# Patient Record
Sex: Female | Born: 1975 | Race: White | Hispanic: No | Marital: Married | Smoking: Never smoker
Health system: Southern US, Community
[De-identification: ages and names within clinical notes are randomized; demographics above are authoritative.]

## PROBLEM LIST (undated history)

## (undated) DIAGNOSIS — G4733 Obstructive sleep apnea (adult) (pediatric): Secondary | ICD-10-CM

## (undated) DIAGNOSIS — K219 Gastro-esophageal reflux disease without esophagitis: Secondary | ICD-10-CM

## (undated) DIAGNOSIS — E559 Vitamin D deficiency, unspecified: Secondary | ICD-10-CM

## (undated) DIAGNOSIS — F419 Anxiety disorder, unspecified: Secondary | ICD-10-CM

## (undated) DIAGNOSIS — R079 Chest pain, unspecified: Secondary | ICD-10-CM

## (undated) DIAGNOSIS — F32A Depression, unspecified: Secondary | ICD-10-CM

## (undated) DIAGNOSIS — I517 Cardiomegaly: Secondary | ICD-10-CM

## (undated) DIAGNOSIS — F329 Major depressive disorder, single episode, unspecified: Secondary | ICD-10-CM

## (undated) DIAGNOSIS — E785 Hyperlipidemia, unspecified: Secondary | ICD-10-CM

## (undated) DIAGNOSIS — F509 Eating disorder, unspecified: Secondary | ICD-10-CM

## (undated) DIAGNOSIS — E079 Disorder of thyroid, unspecified: Secondary | ICD-10-CM

## (undated) DIAGNOSIS — E119 Type 2 diabetes mellitus without complications: Secondary | ICD-10-CM

## (undated) DIAGNOSIS — G43909 Migraine, unspecified, not intractable, without status migrainosus: Secondary | ICD-10-CM

## (undated) DIAGNOSIS — E538 Deficiency of other specified B group vitamins: Secondary | ICD-10-CM

## (undated) DIAGNOSIS — E282 Polycystic ovarian syndrome: Secondary | ICD-10-CM

## (undated) HISTORY — DX: Polycystic ovarian syndrome: E28.2

## (undated) HISTORY — PX: OTHER SURGICAL HISTORY: SHX169

## (undated) HISTORY — DX: Disorder of thyroid, unspecified: E07.9

## (undated) HISTORY — PX: BREAST SURGERY: SHX581

## (undated) HISTORY — DX: Deficiency of other specified B group vitamins: E53.8

## (undated) HISTORY — DX: Obstructive sleep apnea (adult) (pediatric): G47.33

## (undated) HISTORY — DX: Anxiety disorder, unspecified: F41.9

## (undated) HISTORY — DX: Chest pain, unspecified: R07.9

## (undated) HISTORY — DX: Hyperlipidemia, unspecified: E78.5

## (undated) HISTORY — DX: Vitamin D deficiency, unspecified: E55.9

## (undated) HISTORY — DX: Eating disorder, unspecified: F50.9

## (undated) HISTORY — DX: Cardiomegaly: I51.7

## (undated) HISTORY — DX: Type 2 diabetes mellitus without complications: E11.9

## (undated) HISTORY — DX: Migraine, unspecified, not intractable, without status migrainosus: G43.909

## (undated) HISTORY — DX: Gastro-esophageal reflux disease without esophagitis: K21.9

---

## 2000-12-11 ENCOUNTER — Emergency Department (HOSPITAL_COMMUNITY): Admission: EM | Admit: 2000-12-11 | Discharge: 2000-12-11 | Payer: Self-pay | Admitting: Emergency Medicine

## 2000-12-11 ENCOUNTER — Encounter: Payer: Self-pay | Admitting: Emergency Medicine

## 2003-05-28 ENCOUNTER — Inpatient Hospital Stay (HOSPITAL_COMMUNITY): Admission: AD | Admit: 2003-05-28 | Discharge: 2003-05-28 | Payer: Self-pay | Admitting: Obstetrics & Gynecology

## 2004-02-01 ENCOUNTER — Encounter: Admission: RE | Admit: 2004-02-01 | Discharge: 2004-02-01 | Payer: Self-pay | Admitting: Family Medicine

## 2006-08-02 ENCOUNTER — Ambulatory Visit (HOSPITAL_COMMUNITY): Admission: RE | Admit: 2006-08-02 | Discharge: 2006-08-03 | Payer: Self-pay | Admitting: Neurosurgery

## 2006-09-26 ENCOUNTER — Encounter: Admission: RE | Admit: 2006-09-26 | Discharge: 2006-09-26 | Payer: Self-pay | Admitting: Neurosurgery

## 2007-06-09 ENCOUNTER — Inpatient Hospital Stay (HOSPITAL_COMMUNITY): Admission: AD | Admit: 2007-06-09 | Discharge: 2007-06-09 | Payer: Self-pay | Admitting: *Deleted

## 2007-09-02 ENCOUNTER — Inpatient Hospital Stay (HOSPITAL_COMMUNITY): Admission: AD | Admit: 2007-09-02 | Discharge: 2007-09-06 | Payer: Self-pay | Admitting: Obstetrics

## 2007-09-15 ENCOUNTER — Ambulatory Visit: Admission: RE | Admit: 2007-09-15 | Discharge: 2007-09-15 | Payer: Self-pay | Admitting: Obstetrics

## 2009-02-27 ENCOUNTER — Ambulatory Visit: Payer: Self-pay | Admitting: Physician Assistant

## 2009-02-27 ENCOUNTER — Inpatient Hospital Stay (HOSPITAL_COMMUNITY): Admission: AD | Admit: 2009-02-27 | Discharge: 2009-02-27 | Payer: Self-pay | Admitting: Obstetrics

## 2010-03-26 LAB — URINALYSIS, ROUTINE W REFLEX MICROSCOPIC
Bilirubin Urine: NEGATIVE
Glucose, UA: NEGATIVE mg/dL
Ketones, ur: NEGATIVE mg/dL
Leukocytes, UA: NEGATIVE
Nitrite: NEGATIVE
Protein, ur: NEGATIVE mg/dL
Specific Gravity, Urine: <1.005 — ABNORMAL LOW (ref 1.005–1.030)
Urobilinogen, UA: 0.2 mg/dL (ref 0.0–1.0)
pH: 6 (ref 5.0–8.0)

## 2010-03-26 LAB — WET PREP, GENITAL
Clue Cells Wet Prep HPF POC: NONE SEEN
Trich, Wet Prep: NONE SEEN

## 2010-03-26 LAB — POCT PREGNANCY, URINE: Preg Test, Ur: NEGATIVE

## 2010-05-20 NOTE — Op Note (Signed)
NAMEDORELLA, LASTER                ACCOUNT NO.:  [id number]   MEDICAL RECORD NO.:  [id number]          PATIENT TYPE:  OIB   LOCATION:  3172                         FACILITY:  [facility]   PHYSICIAN:  Henry A. Pool, M.D.    DATE OF BIRTH:  March 08, 1975   DATE OF PROCEDURE:  08/02/2006  DATE OF DISCHARGE:                               OPERATIVE REPORT   PREOPERATIVE DIAGNOSIS:  Left L5-S1 herniated pulposus with  radiculopathy.   POSTOPERATIVE DIAGNOSIS:  Left L5-S1 herniated pulposus with  radiculopathy.   PROCEDURE NOTE:  Left L5-S1 laminotomy and microdiskectomy.   SURGEON:  Kathaleen Maser. Pool, M.D.   ASSISTANT:  Donalee Citrin, M.D.   ANESTHESIA:  General.   PREMEDICATION:  Mrs. Sherfield is a 35 year old female with history of back  and left Ohlin extremity pain.  This weakness is consistent left-sided  S1 radiculopathy.  Workups demonstrated evidence of a large left-sided  L5-S1 disk herniation with compression of left-sided S1 nerve root.  The  patient presents now for laminotomy and microdiskectomy in hopes of  improving her symptoms.   DESCRIPTION OF PROCEDURE:  The patient was placed on the table in the  supine position.  After adequate anesthesia was achieved,  the patient  was turned prone onto Wilson frame.  The patient's lumbar region was  prepped and sterilely draped.  The second incision was a skin incision  overlying L5-S1 interspace.  This was carried sharply in the midline.  A  subperiosteal dissection then was performed incising the lamina and  facet joints L5-S1 on the left side.  Deep self-retaining retractor  placed.  Intraoperative x-rays taken.  Level was confirmed.  Laminotomy  then performed using high-speed drill and Kerrison rongeurs to the  inferior aspect lamina of L5, medial aspect of L5-S1 facet joint,  superior rim S1 lamina. Ligament flavum was elevated, resected in  piecemeal fashion using Kerrison rongeurs.  Underlying thecal sac and  exiting S1 nerve root  were identified.  Microscope brought into the  field and used.  Microdissection left side S1 nerve root, underlying  disk herniation, epidural venous plexus, coagulating cut.  Thecal sac  and S1 nerve root gradually mobilized and tracked towards the  midline.  Disk herniation was then incised with a 15-blade in rectangular fashion.  A wide disk space was then achieved.  Using pituitary rongeurs, left  angle pituitary rongeurs and Epstein curets, a disk herniation was  resected.  All loose degenerative disk material was removed from the  interspace.  At this point, a very thorough diskectomy was performed.  There is no injury to thecal sac or nerve roots.  There is no residual  compression upon the thecal sac or nerve roots.  The wound was then  irrigated out with solution.  Gelfoam was placed topically and  hemostasis was found be good.  Microscope and  retractor system were removed.  Hemostasis was achieved with  electrocautery and the wound was then closed in layers with Vicryl  sutures.  Steri-Strips and sterile dressing applied.  There were no  complications.  The patient tolerated the procedure  well and she returns  to the recovery room postop.           ______________________________  Kathaleen Maser. Pool, M.D.     HAP/MEDQ  D:  08/02/2006  T:  08/03/2006  Job:  [id number]

## 2010-10-20 LAB — CBC
MCHC: 34.3
RBC: 4.19
WBC: 11.4 — ABNORMAL HIGH

## 2010-10-20 LAB — DIFFERENTIAL
Basophils Relative: 1
Eosinophils Absolute: 3 — ABNORMAL HIGH
Eosinophils Relative: 26 — ABNORMAL HIGH
Lymphocytes Relative: 40
Monocytes Relative: 4
Neutrophils Relative %: 29 — ABNORMAL LOW

## 2010-10-20 LAB — BASIC METABOLIC PANEL
Calcium: 10
Creatinine, Ser: 0.64
GFR calc Af Amer: >60
GFR calc non Af Amer: >60

## 2010-10-20 LAB — TYPE AND SCREEN: Antibody Screen: NEGATIVE

## 2010-10-20 LAB — ABO/RH: ABO/RH(D): O POS

## 2012-06-08 ENCOUNTER — Encounter (INDEPENDENT_AMBULATORY_CARE_PROVIDER_SITE_OTHER): Payer: Self-pay

## 2012-06-10 ENCOUNTER — Encounter (INDEPENDENT_AMBULATORY_CARE_PROVIDER_SITE_OTHER): Payer: Self-pay

## 2012-06-10 ENCOUNTER — Telehealth (INDEPENDENT_AMBULATORY_CARE_PROVIDER_SITE_OTHER): Payer: Self-pay

## 2012-06-10 NOTE — Telephone Encounter (Signed)
Pt contacted and asked to bring films from Meghan Wallace's to her appt on 06/15/12.  Pt agreed.

## 2012-06-15 ENCOUNTER — Encounter (INDEPENDENT_AMBULATORY_CARE_PROVIDER_SITE_OTHER): Payer: Self-pay | Admitting: General Surgery

## 2012-06-15 ENCOUNTER — Ambulatory Visit (INDEPENDENT_AMBULATORY_CARE_PROVIDER_SITE_OTHER): Payer: BC Managed Care – PPO | Admitting: General Surgery

## 2012-06-15 VITALS — BP 118/70 | HR 74 | Temp 97.4°F | Resp 16 | Ht 66.0 in | Wt 238.2 lb

## 2012-06-15 DIAGNOSIS — N63 Unspecified lump in unspecified breast: Secondary | ICD-10-CM | POA: Insufficient documentation

## 2012-06-15 NOTE — Progress Notes (Signed)
Patient ID: Meghan Wallace, female   DOB: Jul 24, 1975, 37 y.o.   MRN: [id number] She called and stated she'd like to have the procedure done. We will work on scheduling this.

## 2012-06-15 NOTE — Progress Notes (Signed)
Patient ID: Meghan Wallace, female   DOB: 1975/11/04, 37 y.o.   MRN: [id number]  Chief Complaint  Patient presents with  . New Evaluation    eval br mass    HPI Meghan Wallace is a 37 y.o. female.   HPI  She is referred by Dr. Ernestina Penna for evaluation of a right breast mass. She noticed this herself and it was noticed also by Dr. Ernestina Penna. She underwent a diagnostic mammogram and ultrasound but this could not be seen on those exams. She is referred here for further evaluation and treatment.  As for risk factors for breast cancer, first period was before the age of 73, first child birth was at the age of 53. She has polycystic ovarian syndrome. No family history of breast cancer.  Past Medical History  Diagnosis Date  . PCOS (polycystic ovarian syndrome)   . Thyroid disease     hypothyroidism  . Hyperlipidemia     Past Surgical History  Procedure Laterality Date  . Microdiskectomy      History reviewed. No pertinent family history.  Social History History  Substance Use Topics  . Smoking status: Never Smoker   . Smokeless tobacco: Never Used  . Alcohol Use: Yes    No Known Allergies  Current Outpatient Prescriptions  Medication Sig Dispense Refill  . Cholecalciferol (VITAMIN D PO) Take by mouth daily.      Marland Kitchen FLUoxetine (PROZAC) 20 MG capsule Take 20 mg by mouth daily.      . Inositol-Folic Acid (PREGNITUDE) 2000-200 MG-MCG PACK Take by mouth daily.      Marland Kitchen levothyroxine (SYNTHROID, LEVOTHROID) 75 MCG tablet Take 75 mcg by mouth daily before breakfast.      . metFORMIN (GLUCOPHAGE) 1000 MG tablet Take 1,000 mg by mouth 2 (two) times daily with a meal.      . SPRINTEC 28 0.25-35 MG-MCG tablet       . Vitamin D, Ergocalciferol, (DRISDOL) 50000 UNITS CAPS        No current facility-administered medications for this visit.    Review of Systems Review of Systems  Constitutional: Negative.   Respiratory: Negative.   Cardiovascular: Negative.   Gastrointestinal: Negative.      Blood pressure 118/70, pulse 74, temperature 97.4 F (36.3 C), temperature source Temporal, resp. rate 16, height 5\' 6"  (1.676 m), weight 238 lb 3.2 oz (108.047 kg).  Physical Exam Physical Exam  Constitutional: No distress.  Overweight female.  HENT:  Head: Normocephalic and atraumatic.  Neck: Neck supple.  No supraclavicular adenopathy.  Pulmonary/Chest:  Breasts are symmetrical in size. The left breast demonstrates no palpable dominant masses or suspicious skin changes.  The right breast demonstrates a 1.5 cm deep palpable mass between the 6 and 7:00 positions at the edge of the skin areolar interface. No suspicious skin changes.  Musculoskeletal:  No axillary adenopathy.  Lymphadenopathy:    She has no cervical adenopathy.    Data Reviewed Report of mammogram and ultrasound. I've also removed reviewed the mammogram and ultrasound films. These did not demonstrate any mass in the right breast.  Assessment    Right breast mass felt clinically but not seen on imaging studies. This is a discordant finding.  She does have some major risk factors for breast cancer being early menarche and late childbirth.    Plan    We discussed excision of the right breast mass as an outpatient procedure versus return visit and close followup. I went over the procedure and the risks of  the surgery. The risks include but are not limited to bleeding, infection, wound healing probems, and anesthesia. She does have some major risk factors for breast cancer and I discussed this with her. She currently is undecided on how she would like to proceed. I've asked her to call me back after she has made a decision. As for now I will schedule her for a 3 month followup visit.        Shaheen Star J 06/15/2012, 9:49 AM

## 2012-06-15 NOTE — Patient Instructions (Signed)
Call if you would like to schedule the surgery.

## 2012-06-17 ENCOUNTER — Other Ambulatory Visit (INDEPENDENT_AMBULATORY_CARE_PROVIDER_SITE_OTHER): Payer: Self-pay | Admitting: General Surgery

## 2012-07-21 DIAGNOSIS — N6019 Diffuse cystic mastopathy of unspecified breast: Secondary | ICD-10-CM

## 2012-07-27 ENCOUNTER — Telehealth (INDEPENDENT_AMBULATORY_CARE_PROVIDER_SITE_OTHER): Payer: Self-pay

## 2012-07-27 NOTE — Telephone Encounter (Signed)
LMOV for pt to call for pathology results.  Benign.  No malignancy identified.

## 2012-07-27 NOTE — Telephone Encounter (Signed)
Patient called back and was given below message of her path results.

## 2012-07-27 NOTE — Telephone Encounter (Signed)
Patient called in wanting path results. No results in computer. It has been almost a week since surgery. I told her I will look into why it is taking so long to get results. I called [facility] and left a message with medical records requesting results to be faxed over. Told patient we will call her as soon as we receive them.

## 2012-07-27 NOTE — Telephone Encounter (Signed)
Noted.  Please call [facility] Pathology and get results.

## 2012-07-28 NOTE — Telephone Encounter (Signed)
Noted  

## 2012-08-16 ENCOUNTER — Ambulatory Visit (INDEPENDENT_AMBULATORY_CARE_PROVIDER_SITE_OTHER): Payer: BC Managed Care – PPO | Admitting: General Surgery

## 2012-08-16 ENCOUNTER — Encounter (INDEPENDENT_AMBULATORY_CARE_PROVIDER_SITE_OTHER): Payer: Self-pay | Admitting: General Surgery

## 2012-08-16 VITALS — BP 140/62 | HR 80 | Temp 97.2°F | Resp 16 | Ht 66.0 in | Wt 238.0 lb

## 2012-08-16 DIAGNOSIS — Z9889 Other specified postprocedural states: Secondary | ICD-10-CM

## 2012-08-16 NOTE — Patient Instructions (Signed)
Activities as tolerated.  You may use Mederma on your scar if you like.

## 2012-08-16 NOTE — Progress Notes (Signed)
Procedure:  Right breast biopsy  Date:07/21/2012  Pathology:  Fibrocystic change, dilated ducts with mild chronic inflammation, no malignancy.  History:  She is here for her first postoperative visit and is doing well. Her pathology was given to her over the phone.  Exam: General- Is in NAD. Right breast-the lateral circumareolar incision is clean and intact.  Assessment:  Wound healing well and pathology is benign.  Plan:  May use scar cream if she desires. Return visit as needed.

## 2012-08-17 ENCOUNTER — Encounter (INDEPENDENT_AMBULATORY_CARE_PROVIDER_SITE_OTHER): Payer: Self-pay

## 2013-10-12 ENCOUNTER — Other Ambulatory Visit: Payer: Self-pay | Admitting: Family Medicine

## 2013-10-12 DIAGNOSIS — G4452 New daily persistent headache (NDPH): Secondary | ICD-10-CM

## 2013-10-13 ENCOUNTER — Ambulatory Visit
Admission: RE | Admit: 2013-10-13 | Discharge: 2013-10-13 | Disposition: A | Discharge status: Home / Self Care | Payer: BC Managed Care – PPO | Source: Ambulatory Visit | Attending: Family Medicine | Admitting: Family Medicine

## 2013-10-13 DIAGNOSIS — G4452 New daily persistent headache (NDPH): Secondary | ICD-10-CM

## 2013-10-13 MED ORDER — GADOBENATE DIMEGLUMINE 529 MG/ML IV SOLN
20.0000 mL | Freq: Once | INTRAVENOUS | Status: AC | PRN
  Administered 2013-10-13: 20 mL via INTRAVENOUS

## 2014-09-19 ENCOUNTER — Ambulatory Visit
Admission: RE | Admit: 2014-09-19 | Discharge: 2014-09-19 | Disposition: A | Discharge status: Home / Self Care | Payer: BLUE CROSS/BLUE SHIELD | Source: Ambulatory Visit | Attending: Family Medicine | Admitting: Family Medicine

## 2014-09-19 ENCOUNTER — Other Ambulatory Visit: Payer: Self-pay | Admitting: Family Medicine

## 2014-09-19 DIAGNOSIS — R05 Cough: Secondary | ICD-10-CM

## 2014-09-19 DIAGNOSIS — R059 Cough, unspecified: Secondary | ICD-10-CM

## 2014-09-24 ENCOUNTER — Institutional Professional Consult (permissible substitution): Payer: BLUE CROSS/BLUE SHIELD | Admitting: Internal Medicine

## 2014-10-29 ENCOUNTER — Other Ambulatory Visit (INDEPENDENT_AMBULATORY_CARE_PROVIDER_SITE_OTHER): Payer: BLUE CROSS/BLUE SHIELD

## 2014-10-29 ENCOUNTER — Encounter: Payer: Self-pay | Admitting: Internal Medicine

## 2014-10-29 ENCOUNTER — Ambulatory Visit (INDEPENDENT_AMBULATORY_CARE_PROVIDER_SITE_OTHER): Payer: BLUE CROSS/BLUE SHIELD | Admitting: Internal Medicine

## 2014-10-29 ENCOUNTER — Ambulatory Visit
Admission: RE | Admit: 2014-10-29 | Discharge: 2014-10-29 | Disposition: A | Discharge status: Home / Self Care | Payer: BLUE CROSS/BLUE SHIELD | Source: Ambulatory Visit | Attending: Family Medicine | Admitting: Family Medicine

## 2014-10-29 ENCOUNTER — Other Ambulatory Visit: Payer: Self-pay | Admitting: Family Medicine

## 2014-10-29 VITALS — BP 128/70 | HR 93 | Ht 64.0 in | Wt 258.6 lb

## 2014-10-29 DIAGNOSIS — J209 Acute bronchitis, unspecified: Secondary | ICD-10-CM

## 2014-10-29 DIAGNOSIS — J45991 Cough variant asthma: Secondary | ICD-10-CM

## 2014-10-29 LAB — CBC WITH DIFFERENTIAL/PLATELET
Basophils Absolute: 0 10*3/uL (ref 0.0–0.1)
Basophils Relative: 0.4 % (ref 0.0–3.0)
EOS PCT: 1.7 % (ref 0.0–5.0)
Eosinophils Absolute: 0.2 10*3/uL (ref 0.0–0.7)
HEMATOCRIT: 38.5 % (ref 36.0–46.0)
HEMOGLOBIN: 13 g/dL (ref 12.0–15.0)
LYMPHS PCT: 50 % — AB (ref 12.0–46.0)
Lymphs Abs: 4.5 10*3/uL — ABNORMAL HIGH (ref 0.7–4.0)
MCHC: 33.8 g/dL (ref 30.0–36.0)
MCV: 86 fl (ref 78.0–100.0)
MONO ABS: 0.5 10*3/uL (ref 0.1–1.0)
Monocytes Relative: 5.3 % (ref 3.0–12.0)
Neutro Abs: 3.8 10*3/uL (ref 1.4–7.7)
Neutrophils Relative %: 42.6 % — ABNORMAL LOW (ref 43.0–77.0)
Platelets: 437 10*3/uL — ABNORMAL HIGH (ref 150.0–400.0)
RBC: 4.48 Mil/uL (ref 3.87–5.11)
RDW: 13.6 % (ref 11.5–15.5)
WBC: 9 10*3/uL (ref 4.0–10.5)

## 2014-10-29 MED ORDER — MOMETASONE FURO-FORMOTEROL FUM 100-5 MCG/ACT IN AERO: INHALATION_SPRAY | RESPIRATORY_TRACT | Status: DC

## 2014-10-29 MED ORDER — PREDNISONE 10 MG PO TABS: ORAL_TABLET | ORAL | Status: DC

## 2014-10-29 MED ORDER — CODEINE POLT-CHLORPHEN POLT ER 14.7-2.8 MG/5ML PO SUER: 5.0000 mL | Freq: Two times a day (BID) | ORAL | Status: DC | PRN

## 2014-10-29 NOTE — Patient Instructions (Addendum)
Start dulera 100 Take 2 puffs first thing in am and then another 2 puffs about 12 hours later until return   Whenever you are coughing for any reason: Try prilosec otc 20mg   Take 30-60 min before first meal of the day and Pepcid ac (famotidine) 20 mg one @  bedtime until cough is completely gone for at least a week without the need for cough suppression  GERD (REFLUX)  is an extremely common cause of respiratory symptoms just like yours , many times with no obvious heartburn at all.    It can be treated with medication, but also with lifestyle changes including elevation of the head of your bed (ideally with 6 inch  bed blocks),  Smoking cessation, avoidance of late meals, excessive alcohol, and avoid fatty foods, chocolate, peppermint, colas, red wine, and acidic juices such as orange juice.  NO MINT OR MENTHOL PRODUCTS SO NO COUGH DROPS  USE SUGARLESS CANDY INSTEAD (Jolley ranchers or Stover's or Life Savers) or even ice chips will also do - the key is to swallow to prevent all throat clearing. NO OIL BASED VITAMINS - use powdered substitutes.  If not better > Prednisone 10 mg take  4 each am x 2 days,   2 each am x 2 days,  1 each am x 2 days and stop   For cough > tuzistra XR 1-2 tsp every 12 hours as needed  (if not covered best option is delsym 2 tsp every 12 hours )   Please remember to go to the lab   department downstairs for your tests - we will call you with the results when they are available.    Please schedule a follow up office visit in 2 weeks, sooner if needed

## 2014-10-29 NOTE — Progress Notes (Signed)
Subjective:    Patient ID: Meghan AngerJoanna Spagnuolo, female    DOB: 04/12/1975,    MRN: [id number]  HPI  [age] yowf never smoker with tendency to spring > fall with rhinitis flares rx with flonase / zyrtec allergy/eval rx [employer] ? Helped then onset of cough August 2016 s upper resp symtpoms and persistent since then referred by C White to pulmonary clinic 10/29/2014    10/29/2014 1st [facility] office visit/ Gargi Berch   Chief Complaint  Patient presents with  . Pulmonary Consult    Referred by Dr Laurann Montanaynthia White. Pt c/o chronic productive cough with clear mucus, low grade fever, SOB with exertion, and wheezing.   cough present daily since Mid Aug 2016 s rhinitis flare worse during the day esp with talking/ worse when trying to sleep better right as wakes up then worse as day goes on/ not typically wakking her up. rx prednisone x one course > helped more than anything else  proair seems to help breathing but not so much the cough  Has "something for acid" but hasn't picked it up from pharmacy yet  And denies HB  No obvious other patterns in day to day or daytime variabilty or assoc cp or chest tightness,  or overt sinus  symptoms. No unusual exp hx or h/o childhood pna/ asthma or knowledge of premature birth.  Sleeping ok without nocturnal  or early am exacerbation  of respiratory  c/o's or need for noct saba. Also denies any obvious fluctuation of symptoms with weather or environmental changes or other aggravating or alleviating factors except as outlined above   Current Medications, Allergies, Complete Past Medical History, Past Surgical History, Family History, and Social History were reviewed in [facility] electronic medical record.           Review of Systems  Constitutional: Negative for fever, chills and unexpected weight change.  HENT: Negative for congestion, dental problem, ear pain, nosebleeds, postnasal drip, rhinorrhea, sinus pressure, sneezing, sore throat, trouble swallowing  and voice change.   Eyes: Negative for visual disturbance.  Respiratory: Positive for cough, chest tightness, shortness of breath and wheezing. Negative for choking.   Cardiovascular: Negative for chest pain and leg swelling.  Gastrointestinal: Negative for vomiting, abdominal pain and diarrhea.  Genitourinary: Negative for difficulty urinating.  Musculoskeletal: Negative for arthralgias.  Skin: Negative for rash.  Neurological: Negative for tremors, syncope and headaches.  Hematological: Does not bruise/bleed easily.       Objective:   Physical Exam  amb hoarse wf nad  Wt Readings from Last 3 Encounters:  10/29/14 258 lb 9.6 oz (117.3 kg)  08/16/12 238 lb (107.956 kg)  06/15/12 238 lb 3.2 oz (108.047 kg)    Vital signs reviewed    HEENT: nl dentition, turbinates, and orophanx. Nl external ear canals without cough reflex   NECK :  without JVD/Nodes/TM/ nl carotid upstrokes bilaterally   LUNGS: no acc muscle use, clear to A and P bilaterally with  cough  exp maneuvers   CV:  RRR  no s3 or murmur or increase in P2, no edema   ABD:  soft and nontender with nl excursion in the supine position. No bruits or organomegaly, bowel sounds nl  MS:  warm without deformities, calf tenderness, cyanosis or clubbing  SKIN: warm and dry without lesions    NEURO:  alert, approp, no deficits     CXR PA and Lateral:   10/29/2014 :    I personally reviewed images and agree  with radiology impression as follows:    No active cardiopulmonary disease.        Assessment & Plan:

## 2014-10-30 LAB — ALLERGY FULL PROFILE
Allergen, D pternoyssinus,d7: 2.21 kU/L — ABNORMAL HIGH
Allergen,Goose feathers, e70: <0.1 kU/L
Bahia Grass: <0.1 kU/L
Box Elder IgE: 0.16 kU/L — ABNORMAL HIGH
Cat Dander: <0.1 kU/L
Curvularia lunata: <0.1 kU/L
D. farinae: 1.95 kU/L — ABNORMAL HIGH
Dog Dander: <0.1 kU/L
Elm IgE: <0.1 kU/L
Fescue: <0.1 kU/L
G005 Rye, Perennial: <0.1 kU/L
G009 Red Top: <0.1 kU/L
Helminthosporium halodes: <0.1 kU/L
House Dust Hollister: 0.12 kU/L — ABNORMAL HIGH
IGE (IMMUNOGLOBULIN E), SERUM: 79 kU/L (ref <115)
Lamb's Quarters: <0.1 kU/L
OAK CLASS: 2.21 kU/L — AB
Sycamore Tree: <0.1 kU/L

## 2014-10-30 NOTE — Assessment & Plan Note (Addendum)
The most common causes of chronic cough in immunocompetent adults include the following: upper airway cough syndrome (UACS), previously referred to as postnasal drip syndrome (PNDS), which is caused by variety of rhinosinus conditions; (2) asthma; (3) GERD; (4) chronic bronchitis from cigarette smoking or other inhaled environmental irritants; (5) nonasthmatic eosinophilic bronchitis; and (6) bronchiectasis.   These conditions, singly or in combination, have accounted for up to 94% of the causes of chronic cough in prospective studies.   Other conditions have constituted no >6% of the causes in prospective studies These have included bronchogenic carcinoma, chronic interstitial pneumonia, sarcoidosis, left ventricular failure, ACEI-induced cough, and aspiration from a condition associated with pharyngeal dysfunction.    Chronic cough is often simultaneously caused by more than one condition. A single cause has been found from 38 to 82% of the time, multiple causes from 18 to 62%. Multiply caused cough has been the result of three diseases up to 42% of the time.      Elements of her cough sound asthmatic in that she is some better from prednisone and Proair and the coughdeveloped in the setting of seasonal rhinitis suggesting underlying atopy.   The problem is she really has not had an associated flare of rhinitis, and the cough typically does not wake her up  in the early hours in the morning but is brought on by talking typical of the upper airway cough syndrome.   Classic Upper airway cough syndrome, so named because it's frequently impossible to sort out how much is  CR/sinusitis with freq throat clearing (which can be related to primary GERD)   vs  causing  secondary (" extra esophageal")  GERD from wide swings in gastric pressure that occur with throat clearing, often  promoting self use of mint and menthol lozenges that reduce the Sliva esophageal sphincter tone and exacerbate the problem further in a  cyclical fashion.   These are the same pts (now being labeled as having "irritable larynx syndrome" by some cough centers) who not infrequently have a history of having failed to tolerate ace inhibitors,  dry powder inhalers or biphosphonates or report having atypical reflux symptoms that don't respond to standard doses of PPI (or have non acid gerd induced coughing from meds like BCPs which may play a role here) , and are easily confused as having aecopd or asthma flares by even experienced allergists/ pulmonologists.   Since many coughs are actually caused by 2 components, and because she is at high risk of reflux from obesity and BCPs, recommend we treat reflux aggressively while introducing her to the use of low-dose inhaled steroids in the form of Dulera 100 dosed 2 puffs every 12 hours.  The proper method of use, as well as anticipated side effects, of a metered-dose inhaler are discussed and demonstrated to the patient. Improved effectiveness after extensive coaching during this visit to a level of approximately  75%   I had an extended discussion with the patient reviewing all relevant studies completed to date and  lasting of a 60 minute visit    Each maintenance medication was reviewed in detail including most importantly the difference between maintenance and prns and under what circumstances the prns are to be triggered using an action plan format that is not reflected in the computer generated alphabetically organized AVS.    Please see instructions for details which were reviewed in writing and the patient given a copy highlighting the part that I personally wrote and discussed at today's  ov.

## 2014-10-30 NOTE — Assessment & Plan Note (Signed)
Body mass index is 44.37 kg/(m^2).  No results found for: TSH   Contributing to gerd tendency/ doe/reviewed the need and the process to achieve and maintain neg calorie balance > defer f/u primary care including intermittently monitoring thyroid status

## 2014-10-31 NOTE — Progress Notes (Signed)
Quick Note:  Spoke with pt and notified of results per Dr. Wert. Pt verbalized understanding and denied any questions.  ______ 

## 2014-11-12 ENCOUNTER — Ambulatory Visit: Payer: BLUE CROSS/BLUE SHIELD | Admitting: Internal Medicine

## 2014-11-13 ENCOUNTER — Ambulatory Visit (INDEPENDENT_AMBULATORY_CARE_PROVIDER_SITE_OTHER): Payer: BLUE CROSS/BLUE SHIELD | Admitting: Internal Medicine

## 2014-11-13 ENCOUNTER — Encounter: Payer: Self-pay | Admitting: Internal Medicine

## 2014-11-13 VITALS — BP 110/66 | HR 93 | Ht 64.0 in | Wt 257.4 lb

## 2014-11-13 DIAGNOSIS — J45991 Cough variant asthma: Secondary | ICD-10-CM | POA: Diagnosis not present

## 2014-11-13 MED ORDER — MOMETASONE FURO-FORMOTEROL FUM 100-5 MCG/ACT IN AERO: INHALATION_SPRAY | RESPIRATORY_TRACT | Status: DC

## 2014-11-13 NOTE — Progress Notes (Signed)
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 Helped then onset of cough August 2016 s upper resp symtpoms and persistent since then referred by C White to pulmonary clinic 10/29/2014    History of Present Illness  10/29/2014 1st [facility] office visit/ Wert   Chief Complaint  Patient presents with  . Pulmonary Consult    Referred by Dr Laurann Montana. Pt c/o chronic productive cough with clear mucus, low grade fever, SOB with exertion, and wheezing.   cough present daily since Mid Aug 2016 s rhinitis flare worse during the day esp with talking/ worse when trying to sleep better right as wakes up then worse as day goes on/ not typically waking her up. rx prednisone x one course > helped more than anything else  proair seems to help breathing but not so much the cough  Has "something for acid" but hasn't picked it up from pharmacy yet  And denies HB rec Start dulera 100 Take 2 puffs first thing in am and then another 2 puffs about 12 hours later until return  Whenever you are coughing for any reason: Try prilosec otc   Take 30-60 min before first meal of the day and Pepcid ac (famotidine) 20 mg one @  bedtime until cough is completely gone for at least a week without the need for cough suppression GERD diet  If not better > Prednisone 10 mg take  4 each am x 2 days,   2 each am x 2 days,  1 each am x 2 days and stop  For cough > tuzistra XR 1-2 tsp every 12 hours as needed  (if not covered best option is delsym 2 tsp every 12 hours )       11/13/2014  f/u ov/Wert re: cough variant asthma  Chief Complaint  Patient presents with  . Follow-up    Reports breathing is better. C/o dry cough at night. no wheezing, no chest tx, no PND.    the cough at night does not  keep her up nor does it exacerbate first thing in the morning nor is it excessive productive. Overall much better after she took the prednisone and has not lost any ground since he stopped it while on Dulera 100 2 bid and still on gerd rx- Not limited by breathing from desired activities  Though not very active   No obvious day to day or daytime variability or assoc   cp or chest tightness, subjective wheeze or overt sinus or hb symptoms. No unusual exp hx or h/o childhood pna/ asthma or knowledge of premature birth.  Sleeping ok without nocturnal  or early am exacerbation  of respiratory  c/o's or need for noct saba. Also denies any obvious fluctuation of symptoms with weather or environmental changes or other aggravating or alleviating factors except as outlined above   Current Medications, Allergies, Complete Past Medical History, Past Surgical History, Family History, and Social History were reviewed in [facility] record.  ROS  The following are not active complaints unless bolded sore throat, dysphagia, dental problems, itching, sneezing,  nasal congestion or excess/ purulent secretions, ear ache,   fever, chills, sweats, unintended wt loss, classically pleuritic or exertional cp, hemoptysis,  orthopnea pnd or leg swelling, presyncope, palpitations, abdominal pain, anorexia, nausea, vomiting, diarrhea  or change in bowel or bladder habits, change in stools or urine, dysuria,hematuria,  rash, arthralgias, visual complaints, headache, numbness, weakness or ataxia or problems with walking or coordination,  change in mood/affect or memory.                                  Objective:   Physical Exam  amb obese  wf nad  11/13/2014        257  Wt Readings from Last 3 Encounters:  10/29/14 258 lb 9.6 oz (117.3 kg)  08/16/12 238 lb (107.956 kg)  06/15/12 238 lb 3.2 oz (108.047 kg)    Vital signs reviewed    HEENT: nl dentition, turbinates, and orophanx. Nl  external ear canals without cough reflex   NECK :  without JVD/Nodes/TM/ nl carotid upstrokes bilaterally   LUNGS: no acc muscle use, clear to A and P bilaterally    CV:  RRR  no s3 or murmur or increase in P2, no edema   ABD:  soft and nontender with nl excursion in the supine position. No bruits or organomegaly, bowel sounds nl  MS:  warm without deformities, calf tenderness, cyanosis or clubbing  SKIN: warm and dry without lesions    NEURO:  alert, approp, no deficits     CXR PA and Lateral:   10/29/2014 :    I personally reviewed images and agree with radiology impression as follows:    No active cardiopulmonary disease.        Assessment & Plan:

## 2014-11-13 NOTE — Patient Instructions (Signed)
Continue dulera 100 Take 2 puffs first thing in am and then another 2 puffs about 12 hours later.   If no cough x one week then try off the prilosec  and just take the pepcid after supper x  a week a try off it, too   Please schedule a follow up office visit in 6 weeks, call sooner if needed

## 2014-11-14 ENCOUNTER — Encounter: Payer: Self-pay | Admitting: Internal Medicine

## 2014-11-14 NOTE — Assessment & Plan Note (Signed)
Body mass index is 44.16 kg/(m^2).  No results found for: TSH   Contributing to gerd tendency/ doe/reviewed the need and the process to achieve and maintain neg calorie balance > defer f/u primary care including intermittently monitoring thyroid status

## 2014-11-14 NOTE — Assessment & Plan Note (Signed)
-  10/29/2014  dulera 100 2bid - Allergy profile 10/29/14 >  Eos 0.2 / IgE 79 Pos RAST trees/dust     -  11/13/2014  extensive coaching HFA effectiveness =    90%   All goals of chronic asthma control met including optimal function and elimination of symptoms with minimal need for rescue therapy.  Contingencies discussed in full including contacting this office immediately if not controlling the symptoms using the rule of two's.        

## 2014-12-25 ENCOUNTER — Ambulatory Visit: Payer: BLUE CROSS/BLUE SHIELD | Admitting: Internal Medicine

## 2016-12-05 ENCOUNTER — Encounter (HOSPITAL_COMMUNITY): Payer: Self-pay | Admitting: Nurse Practitioner

## 2016-12-05 ENCOUNTER — Inpatient Hospital Stay (HOSPITAL_COMMUNITY)
Admission: EM | Admit: 2016-12-05 | Discharge: 2016-12-15 | DRG: 603 | Disposition: A | Discharge status: Home / Self Care | Payer: BLUE CROSS/BLUE SHIELD | Attending: Internal Medicine | Admitting: Internal Medicine

## 2016-12-05 ENCOUNTER — Other Ambulatory Visit: Payer: Self-pay

## 2016-12-05 DIAGNOSIS — E282 Polycystic ovarian syndrome: Secondary | ICD-10-CM | POA: Diagnosis present

## 2016-12-05 DIAGNOSIS — L03116 Cellulitis of left lower limb: Principal | ICD-10-CM | POA: Diagnosis present

## 2016-12-05 DIAGNOSIS — Z6841 Body Mass Index (BMI) 40.0 and over, adult: Secondary | ICD-10-CM | POA: Diagnosis not present

## 2016-12-05 DIAGNOSIS — J45991 Cough variant asthma: Secondary | ICD-10-CM | POA: Diagnosis present

## 2016-12-05 DIAGNOSIS — E039 Hypothyroidism, unspecified: Secondary | ICD-10-CM | POA: Diagnosis present

## 2016-12-05 DIAGNOSIS — Z79899 Other long term (current) drug therapy: Secondary | ICD-10-CM

## 2016-12-05 DIAGNOSIS — L03119 Cellulitis of unspecified part of limb: Secondary | ICD-10-CM | POA: Diagnosis not present

## 2016-12-05 DIAGNOSIS — E785 Hyperlipidemia, unspecified: Secondary | ICD-10-CM | POA: Diagnosis present

## 2016-12-05 DIAGNOSIS — L02419 Cutaneous abscess of limb, unspecified: Secondary | ICD-10-CM

## 2016-12-05 DIAGNOSIS — Z91018 Allergy to other foods: Secondary | ICD-10-CM | POA: Diagnosis not present

## 2016-12-05 DIAGNOSIS — L0390 Cellulitis, unspecified: Secondary | ICD-10-CM | POA: Diagnosis present

## 2016-12-05 DIAGNOSIS — E079 Disorder of thyroid, unspecified: Secondary | ICD-10-CM | POA: Diagnosis not present

## 2016-12-05 DIAGNOSIS — Z880 Allergy status to penicillin: Secondary | ICD-10-CM

## 2016-12-05 DIAGNOSIS — G43909 Migraine, unspecified, not intractable, without status migrainosus: Secondary | ICD-10-CM | POA: Diagnosis present

## 2016-12-05 DIAGNOSIS — B999 Unspecified infectious disease: Secondary | ICD-10-CM | POA: Diagnosis not present

## 2016-12-05 HISTORY — DX: Depression, unspecified: F32.A

## 2016-12-05 HISTORY — DX: Major depressive disorder, single episode, unspecified: F32.9

## 2016-12-05 LAB — GLUCOSE, CAPILLARY
GLUCOSE-CAPILLARY: 87 mg/dL (ref 65–99)
GLUCOSE-CAPILLARY: 91 mg/dL (ref 65–99)

## 2016-12-05 LAB — CBC WITH DIFFERENTIAL/PLATELET
Basophils Absolute: 0 10*3/uL (ref 0.0–0.1)
Basophils Relative: 0 %
Eosinophils Absolute: 0.2 10*3/uL (ref 0.0–0.7)
Eosinophils Relative: 1 %
HEMATOCRIT: 33.4 % — AB (ref 36.0–46.0)
HEMOGLOBIN: 11.3 g/dL — AB (ref 12.0–15.0)
Lymphocytes Relative: 18 %
Lymphs Abs: 2.4 10*3/uL (ref 0.7–4.0)
MCH: 28.2 pg (ref 26.0–34.0)
MCHC: 33.8 g/dL (ref 30.0–36.0)
MCV: 83.3 fL (ref 78.0–100.0)
Monocytes Absolute: 0.6 10*3/uL (ref 0.1–1.0)
Monocytes Relative: 5 %
Neutro Abs: 10.1 10*3/uL — ABNORMAL HIGH (ref 1.7–7.7)
Neutrophils Relative %: 76 %
Platelets: 352 10*3/uL (ref 150–400)
RBC: 4.01 MIL/uL (ref 3.87–5.11)
RDW: 14.9 % (ref 11.5–15.5)
WBC: 13.3 10*3/uL — ABNORMAL HIGH (ref 4.0–10.5)

## 2016-12-05 LAB — BASIC METABOLIC PANEL
Anion gap: 10 (ref 5–15)
BUN: 8 mg/dL (ref 6–20)
CHLORIDE: 102 mmol/L (ref 101–111)
CO2: 22 mmol/L (ref 22–32)
Calcium: 9.1 mg/dL (ref 8.9–10.3)
Creatinine, Ser: 0.72 mg/dL (ref 0.44–1.00)
GFR calc Af Amer: >60 mL/min (ref >60)
Glucose, Bld: 104 mg/dL — ABNORMAL HIGH (ref 65–99)
POTASSIUM: 3.5 mmol/L (ref 3.5–5.1)
SODIUM: 134 mmol/L — AB (ref 135–145)

## 2016-12-05 LAB — I-STAT CG4 LACTIC ACID, ED: LACTIC ACID, VENOUS: 0.69 mmol/L (ref 0.5–1.9)

## 2016-12-05 MED ORDER — ESCITALOPRAM OXALATE 20 MG PO TABS
20.0000 mg | ORAL_TABLET | Freq: Every day | ORAL | Status: DC
  Administered 2016-12-06 – 2016-12-15 (×10): 20 mg via ORAL
  Filled 2016-12-05 (×10): qty 1

## 2016-12-05 MED ORDER — INOSITOL-FOLIC ACID 2000-200 MG-MCG PO PACK: PACK | Freq: Every day | ORAL | Status: DC

## 2016-12-05 MED ORDER — LEVOTHYROXINE SODIUM 75 MCG PO TABS
75.0000 ug | ORAL_TABLET | Freq: Every day | ORAL | Status: DC
  Administered 2016-12-06 – 2016-12-15 (×10): 75 ug via ORAL
  Filled 2016-12-05 (×11): qty 1

## 2016-12-05 MED ORDER — SODIUM CHLORIDE 0.9% FLUSH: 3.0000 mL | INTRAVENOUS | Status: DC | PRN

## 2016-12-05 MED ORDER — CLINDAMYCIN PHOSPHATE 600 MG/50ML IV SOLN
600.0000 mg | Freq: Four times a day (QID) | INTRAVENOUS | Status: DC
  Administered 2016-12-05 – 2016-12-07 (×7): 600 mg via INTRAVENOUS
  Filled 2016-12-05 (×9): qty 50

## 2016-12-05 MED ORDER — HYDROMORPHONE HCL 1 MG/ML IJ SOLN
0.5000 mg | Freq: Once | INTRAMUSCULAR | Status: AC
  Administered 2016-12-05: 0.5 mg via INTRAVENOUS
  Filled 2016-12-05: qty 1

## 2016-12-05 MED ORDER — CLINDAMYCIN PHOSPHATE 600 MG/50ML IV SOLN
600.0000 mg | Freq: Once | INTRAVENOUS | Status: AC
  Administered 2016-12-05: 600 mg via INTRAVENOUS
  Filled 2016-12-05: qty 50

## 2016-12-05 MED ORDER — HYDROMORPHONE HCL 1 MG/ML IJ SOLN
1.0000 mg | Freq: Once | INTRAMUSCULAR | Status: AC
  Administered 2016-12-05: 20:00:00 1 mg via INTRAVENOUS
  Filled 2016-12-05: qty 1

## 2016-12-05 MED ORDER — ACETAMINOPHEN 325 MG PO TABS
650.0000 mg | ORAL_TABLET | Freq: Four times a day (QID) | ORAL | Status: DC | PRN
  Administered 2016-12-05 – 2016-12-15 (×6): 650 mg via ORAL
  Filled 2016-12-05 (×6): qty 2

## 2016-12-05 MED ORDER — SODIUM CHLORIDE 0.9% FLUSH
3.0000 mL | Freq: Two times a day (BID) | INTRAVENOUS | Status: DC
  Administered 2016-12-07 – 2016-12-14 (×3): 3 mL via INTRAVENOUS

## 2016-12-05 MED ORDER — SODIUM CHLORIDE 0.9 % IV BOLUS (SEPSIS)
1000.0000 mL | Freq: Once | INTRAVENOUS | Status: AC
  Administered 2016-12-05: 1000 mL via INTRAVENOUS

## 2016-12-05 MED ORDER — ACETAMINOPHEN 650 MG RE SUPP: 650.0000 mg | Freq: Four times a day (QID) | RECTAL | Status: DC | PRN

## 2016-12-05 MED ORDER — INSULIN ASPART 100 UNIT/ML ~~LOC~~ SOLN: 0.0000 [IU] | Freq: Three times a day (TID) | SUBCUTANEOUS | Status: DC

## 2016-12-05 MED ORDER — ONDANSETRON HCL 4 MG/2ML IJ SOLN
4.0000 mg | Freq: Once | INTRAMUSCULAR | Status: AC
  Administered 2016-12-05: 4 mg via INTRAVENOUS
  Filled 2016-12-05: qty 2

## 2016-12-05 MED ORDER — TRAMADOL HCL 50 MG PO TABS
100.0000 mg | ORAL_TABLET | Freq: Four times a day (QID) | ORAL | Status: DC | PRN
  Administered 2016-12-05 – 2016-12-14 (×23): 100 mg via ORAL
  Filled 2016-12-05 (×23): qty 2

## 2016-12-05 MED ORDER — SODIUM CHLORIDE 0.9 % IV SOLN
250.0000 mL | INTRAVENOUS | Status: DC | PRN
Start: 2016-12-05 — End: 2016-12-15
  Administered 2016-12-05: 16:00:00 250 mL via INTRAVENOUS

## 2016-12-05 NOTE — ED Triage Notes (Signed)
Patient states she went to Urgent care on Thursday with small area of redness. They performed US and started her on Sulfa which she has been taken as prescribed. Per patient the redness is now spreading and pain is worsening. She has an appointment with PCP on upcoming Monday but she woke up this morning feeling worst. Reports Tmax 102 at home which she has been rotating with Tylenol and ibuprofen. It has been normal in the 98s over the past 24 hours.

## 2016-12-05 NOTE — ED Notes (Signed)
Report given to Ardae [id number].

## 2016-12-05 NOTE — Progress Notes (Signed)
PHARMACIST - PHYSICIAN ORDER COMMUNICATION  CONCERNING: P&T Medication Policy on Herbal Medications  DESCRIPTION:  This patient's order for:  Pregnitude  has been noted.  This product(s) is classified as an "herbal" or natural product. Due to a lack of definitive safety studies or FDA approval, nonstandard manufacturing practices, plus the potential risk of unknown drug-drug interactions while on inpatient medications, the Pharmacy and Therapeutics Committee does not permit the use of "herbal" or natural products of this type within [facility].   ACTION TAKEN: The pharmacy department is unable to verify this order at this time and your patient has been informed of this safety policy. Please reevaluate patient's clinical condition at discharge and address if the herbal or natural product(s) should be resumed at that time. Herby AbrahamMichelle T. Kerry-Anne Mezo, Pharm.D. [phone] 12/05/2016 3:24 PM

## 2016-12-05 NOTE — ED Notes (Signed)
Assigned 1609 @ 13:15 call report @ 13:35

## 2016-12-05 NOTE — H&P (Signed)
History and Physical    Meghan Wallace ZOX:[id number] DOB: 22-Mar-1975 DOA: 12/05/2016  PCP: Jeannie Done, MD  Patient coming from:  home  Chief Complaint:  Leg swelling and red  HPI: Meghan Wallace is a 41 y.o. female with medical history significant of PCOS, obesity, hypothyroidism comes in with over 2 days of worsening left Selmon leg swelling, pain, and redness.  This all started 2 days ago and she was started on bactrim by her PCP.  It has progressively gotten worse despite oral abx.  She started spiking fever last night up to 102.  No n/v/d.  She did have a recent ultrasound also which was neg for DVT.  Pt being referred for admission for worsening cellulitis despite attempt at outpatient treatment.  Review of Systems: As per HPI otherwise 10 point review of systems negative.   Past Medical History:  Diagnosis Date  . Depression   . Hyperlipidemia   . PCOS (polycystic ovarian syndrome)   . Thyroid disease    hypothyroidism    Past Surgical History:  Procedure Laterality Date  . BREAST SURGERY     rt lumpectomy 2014  . microdiskectomy       reports that  has never smoked. she has never used smokeless tobacco. She reports that she does not drink alcohol or use drugs.  Allergies  Allergen Reactions  . Other     Other reaction(s): Other (See Comments) Fresh fruits ( apples, pears, and plums ) makes throat itch  . Penicillin G Hives    Has patient had a PCN reaction causing immediate rash, facial/tongue/throat swelling, SOB or lightheadedness with hypotension: {yes Has patient had a PCN reaction causing severe rash involving mucus membranes or skin necrosis: no Has patient had a PCN reaction that required hospitalization: no Has patient had a PCN reaction occurring within the last 10 years: yes If all of the above answers are "NO", then may proceed with Cephalosporin use.     No family history on file.  Prior to Admission medications   Medication Sig Start Date End Date  Taking? Authorizing Provider  Cholecalciferol (VITAMIN D PO) Take 1,000 mg by mouth 2 (two) times daily.    Yes [provider]  escitalopram (LEXAPRO) 20 MG tablet Take 20 mg by mouth daily. 11/21/16  Yes [provider]  fenofibrate 160 MG tablet TAKE 1 TABLET WITH A MEAL ONCE A DAY 10/03/14  Yes [provider]  Inositol-Folic Acid (PREGNITUDE) 2000-200 MG-MCG PACK Take by mouth daily.   Yes [provider]  levothyroxine (SYNTHROID, LEVOTHROID) 75 MCG tablet Take 75 mcg by mouth daily before breakfast.   Yes [provider]  metFORMIN (GLUCOPHAGE) 1000 MG tablet Take 1,000 mg by mouth 2 (two) times daily with a meal.   Yes [provider]  sulfamethoxazole-trimethoprim (BACTRIM DS,SEPTRA DS) 800-160 MG tablet Take 1 tablet by mouth 2 (two) times daily. 12/03/16  Yes [provider]  mometasone-formoterol (DULERA) 100-5 MCG/ACT AERO Take 2 puffs first thing in am and then another 2 puffs about 12 hours later. Patient not taking: Reported on 12/05/2016 11/13/14   Nyoka Cowden, MD  predniSONE (DELTASONE) 10 MG tablet Take  4 each am x 2 days,   2 each am x 2 days,  1 each am x 2 days and stop Patient not taking: Reported on 11/13/2014 10/29/14   Nyoka Cowden, MD    Physical Exam: Vitals:   12/05/16 1048 12/05/16 1123 12/05/16 1207 12/05/16 1230  BP: Marland Kitchen)  115/57 118/61 120/69 121/69  Pulse: 79 72 77 78  Resp: 18 17 18 17   Temp:      TempSrc:      SpO2: 96% 97% 97% 99%  Weight:      Height:          Constitutional: NAD, calm, comfortable Vitals:   12/05/16 1048 12/05/16 1123 12/05/16 1207 12/05/16 1230  BP: (!) 115/57 118/61 120/69 121/69  Pulse: 79 72 77 78  Resp: 18 17 18 17   Temp:      TempSrc:      SpO2: 96% 97% 97% 99%  Weight:      Height:       Eyes: PERRL, lids and conjunctivae normal ENMT: Mucous membranes are moist. Posterior pharynx clear of any exudate or lesions.Normal dentition.  Neck: normal,  supple, no masses, no thyromegaly Respiratory: clear to auscultation bilaterally, no wheezing, no crackles. Normal respiratory effort. No accessory muscle use.  Cardiovascular: Regular rate and rhythm, no murmurs / rubs / gallops. No extremity edema. 2+ pedal pulses. No carotid bruits.  Abdomen: no tenderness, no masses palpated. No hepatosplenomegaly. Bowel sounds positive.  Musculoskeletal: no clubbing / cyanosis. No joint deformity upper and [place] extremities. Good ROM, no contractures. Normal muscle tone.  Skin: left leg from ankle up to more than half way up calf with swelling and erythema c/w cellulitis, no open areas ofdrainage no induration or flunctuance anywhere Neurologic: CN 2-12 grossly intact. Sensation intact, DTR normal. Strength 5/5 in all 4.  Psychiatric: Normal judgment and insight. Alert and oriented x 3. Normal mood.    Labs on Admission: I have personally reviewed following labs and imaging studies  CBC: Recent Labs  Lab 12/05/16 1021  WBC 13.3*  NEUTROABS 10.1*  HGB 11.3*  HCT 33.4*  MCV 83.3  PLT 352   Basic Metabolic Panel: Recent Labs  Lab 12/05/16 1021  NA 134*  K 3.5  CL 102  CO2 22  GLUCOSE 104*  BUN 8  CREATININE 0.72  CALCIUM 9.1   GFR: Estimated Creatinine Clearance: 126.2 mL/min (by C-G formula based on SCr of 0.72 mg/dL). Liver Function Tests: No results for input(s): AST, ALT, ALKPHOS, BILITOT, PROT, ALBUMIN in the last 168 hours. No results for input(s): LIPASE, AMYLASE in the last 168 hours. No results for input(s): AMMONIA in the last 168 hours. Coagulation Profile: No results for input(s): INR, PROTIME in the last 168 hours. Cardiac Enzymes: No results for input(s): CKTOTAL, CKMB, CKMBINDEX, TROPONINI in the last 168 hours. BNP (last 3 results) No results for input(s): PROBNP in the last 8760 hours. HbA1C: No results for input(s): HGBA1C in the last 72 hours. CBG: No results for input(s): GLUCAP in the last 168 hours. Lipid  Profile: No results for input(s): CHOL, HDL, LDLCALC, TRIG, CHOLHDL, LDLDIRECT in the last 72 hours. Thyroid Function Tests: No results for input(s): TSH, T4TOTAL, FREET4, T3FREE, THYROIDAB in the last 72 hours. Anemia Panel: No results for input(s): VITAMINB12, FOLATE, FERRITIN, TIBC, IRON, RETICCTPCT in the last 72 hours. Urine analysis:    Component Value Date/Time   COLORURINE YELLOW 02/27/2009 2025   APPEARANCEUR CLEAR 02/27/2009 2025   LABSPEC <1.005 (L) 02/27/2009 2025   PHURINE 6.0 02/27/2009 2025   GLUCOSEU NEGATIVE 02/27/2009 2025   HGBUR SMALL (A) 02/27/2009 2025   BILIRUBINUR NEGATIVE 02/27/2009 2025   KETONESUR NEGATIVE 02/27/2009 2025   PROTEINUR NEGATIVE 02/27/2009 2025   UROBILINOGEN 0.2 02/27/2009 2025   NITRITE NEGATIVE 02/27/2009 2025   LEUKOCYTESUR NEGATIVE 02/27/2009  2025   Sepsis Labs: !!!!!!!!!!!!!!!!!!!!!!!!!!!!!!!!!!!!!!!!!!!! @LABRCNTIP (procalcitonin:4,lacticidven:4) )No results found for this or any previous visit (from the past 240 hour(s)).   Radiological Exams on Admission: No results found.  Assessment/Plan 41 yo female with LLE cellulitis  Principal Problem:   Cellulitis- PCN allergic (hives), place on clindamycin IV and elevate leg.  Broaden abx if no response and improvement in the next 24 hours.  Nontoxic.  She had a callus on left heel that she tried to shave off which is the likely point of entry  Active Problems:   Severe obesity (BMI >= 40) (HCC)- noted   PCOS (polycystic ovarian syndrome)- holding metformin, ck sugars and cover with ssi if needed   Hyperlipidemia- cont home med   Thyroid disease- cont home med     DVT prophylaxis:  scds if she can tolerate, otherwise ambulate Code Status:  full Family Communication:  husband Disposition Plan:  Per day team Consults called:  none Admission status:  admit   Laneta Guerin A MD [facility]  If 7PM-7AM, please contact night-coverage [email or web address] Password  [email or web address]  12/05/2016, 1:15 PM

## 2016-12-05 NOTE — ED Provider Notes (Signed)
[facility]-EMERGENCY DEPT Provider Note   CSN: [id number] Arrival date & time: 12/05/16  4098     History   Chief Complaint Chief Complaint  Patient presents with  . Fever  . Leg Pain    left leg     HPI Meghan Wallace is a 41 y.o. female.  Patient presents with complaint of fevers to 102 F at home, left leg pain, redness, and warmth starting 3 days ago.  Prior to this, patient had some mild left ankle swelling.  She admits to picking at some itchy skin on her heel.  Patient then developed redness and infection.  She went to an outside urgent care and was prescribed Bactrim.  She also had an ultrasound performed at [facility] which was negative for DVT.  Redness continued to get worse despite antibiotics.  No nausea or vomiting.  No diarrhea.  Patient is on metformin for PCOS, no history of diabetes.  She is not a smoker.  She has never had boils or been diagnosed with an MRSA infection. The onset of this condition was acute. Aggravating factors: none. Alleviating factors: none.        Past Medical History:  Diagnosis Date  . Depression   . Hyperlipidemia   . PCOS (polycystic ovarian syndrome)   . Thyroid disease    hypothyroidism    Patient Active Problem List   Diagnosis Date Noted  . Severe obesity (BMI >= 40) (HCC) 10/30/2014  . Cough variant asthma vs UACS vs combo of two 10/29/2014  . Breast mass in female-right breast 6:00 position, 1.5 cm-benign. 06/15/2012    Past Surgical History:  Procedure Laterality Date  . BREAST SURGERY     rt lumpectomy 2014  . microdiskectomy      OB History    No data available       Home Medications    Prior to Admission medications   Medication Sig Start Date End Date Taking? Authorizing Provider  Cholecalciferol (VITAMIN D PO) Take by mouth daily.    [provider]  fenofibrate 160 MG tablet TAKE 1 TABLET WITH A MEAL ONCE A DAY 10/03/14   [provider]  FLUoxetine (PROZAC) 20 MG  capsule Take 20 mg by mouth daily.    [provider]  HYDROcodone-homatropine (HYCODAN) 5-1.5 MG/5ML syrup Take 5 mLs by mouth at bedtime as needed for cough.    [provider]  Inositol-Folic Acid (PREGNITUDE) 2000-200 MG-MCG PACK Take by mouth daily.    [provider]  levothyroxine (SYNTHROID, LEVOTHROID) 75 MCG tablet Take 75 mcg by mouth daily before breakfast.    [provider]  metFORMIN (GLUCOPHAGE) 1000 MG tablet Take 1,000 mg by mouth 2 (two) times daily with a meal.    [provider]  mometasone-formoterol (DULERA) 100-5 MCG/ACT AERO Take 2 puffs first thing in am and then another 2 puffs about 12 hours later. 11/13/14   Nyoka Cowden, MD  predniSONE (DELTASONE) 10 MG tablet Take  4 each am x 2 days,   2 each am x 2 days,  1 each am x 2 days and stop Patient not taking: Reported on 11/13/2014 10/29/14   Nyoka Cowden, MD  PROAIR HFA 108 (90 BASE) MCG/ACT inhaler 2 puffs every 4 (four) hours as needed. 09/07/14   [provider]  SPRINTEC 28 0.25-35 MG-MCG tablet Take 1 tablet by mouth daily.  06/09/12   [provider]  Vitamin D, Ergocalciferol, (DRISDOL) 50000 UNITS CAPS  05/10/12   [provider]    Family History No family history on file.  Social History Social History   Tobacco Use  . Smoking status: Never Smoker  . Smokeless tobacco: Never Used  Substance Use Topics  . Alcohol use: No    Frequency: Never  . Drug use: No     Allergies   Other and Penicillin g   Review of Systems Review of Systems  Constitutional: Negative for fever.  HENT: Negative for rhinorrhea and sore throat.   Eyes: Negative for redness.  Respiratory: Negative for cough.   Cardiovascular: Negative for chest pain.  Gastrointestinal: Negative for abdominal pain, diarrhea, nausea and vomiting.  Genitourinary: Negative for dysuria.  Musculoskeletal: Positive for myalgias.  Skin: Positive for color change and wound.  Negative for rash.  Neurological: Negative for headaches.     Physical Exam Updated Vital Signs BP 111/60 (BP Location: Right Arm)   Pulse 71   Temp 98.5 F (36.9 C) (Oral)   Resp 18   Ht 5\' 6"  (1.676 m)   Wt 124.7 kg (275 lb)   SpO2 99%   BMI 44.39 kg/m   Physical Exam  Constitutional: She appears well-developed and well-nourished.  HENT:  Head: Normocephalic and atraumatic.  Eyes: Conjunctivae are normal. Right eye exhibits no discharge. Left eye exhibits no discharge.  Neck: Normal range of motion. Neck supple.  Cardiovascular: Normal rate, regular rhythm and normal heart sounds.  Pulmonary/Chest: Effort normal and breath sounds normal.  Abdominal: Soft. There is no tenderness.  Musculoskeletal: She exhibits edema and tenderness.       Left hip: Normal. She exhibits normal range of motion and normal strength.       Left knee: Normal. She exhibits normal range of motion and no swelling.       Left ankle: She exhibits swelling. Tenderness.       Left [place] leg: She exhibits tenderness and edema. She exhibits no bony tenderness.  Neurological: She is alert.  Skin: Skin is warm and dry.  Patient with large circumferential irregular area of cellulitis noted extending from the foot and posterior ankle to below the knee as shown in pictures.  There is very warm and tender.  Psychiatric: She has a normal mood and affect.  Nursing note and vitals reviewed.           ED Treatments / Results  Labs (all labs ordered are listed, but only abnormal results are displayed) Labs Reviewed  CBC WITH DIFFERENTIAL/PLATELET - Abnormal; Notable for the following components:      Result Value   WBC 13.3 (*)    Hemoglobin 11.3 (*)    HCT 33.4 (*)    Neutro Abs 10.1 (*)    All other components within normal limits  BASIC METABOLIC PANEL - Abnormal; Notable for the following components:   Sodium 134 (*)    Glucose, Bld 104 (*)    All other components within normal limits  I-STAT  CG4 LACTIC ACID, ED    Procedures Procedures (including critical care time)  Medications Ordered in ED Medications  clindamycin (CLEOCIN) IVPB 600 mg (0 mg Intravenous Stopped 12/05/16 1156)  HYDROmorphone (DILAUDID) injection 0.5 mg (0.5 mg Intravenous Given 12/05/16 1042)  ondansetron (ZOFRAN) injection 4 mg (4 mg Intravenous Given 12/05/16 1042)  sodium chloride 0.9 % bolus 1,000 mL (0 mLs Intravenous Stopped 12/05/16 1126)     Initial Impression / Assessment and Plan / ED Course  I have reviewed the triage vital  signs and the nursing notes.  Pertinent labs & imaging results that were available during my care of the patient were reviewed by me and considered in my medical decision making (see chart for details).     Patient seen and examined. Work-up initiated. Medications ordered.   Vital signs reviewed and are as follows: BP 111/60 (BP Location: Right Arm)   Pulse 71   Temp 98.5 F (36.9 C) (Oral)   Resp 18   Ht 5\' 6"  (1.676 m)   Wt 124.7 kg (275 lb)   SpO2 99%   BMI 44.39 kg/m   Pt stable. Offered admission vs home with abx and recheck in 24 hrs. Pt would like to be admitted.   Spoke with Dr. Onalee Huaavid who will admit.   Final Clinical Impressions(s) / ED Diagnoses   Final diagnoses:  Cellulitis of left Tech extremity   Pt with cellulitis, fevers, leukocytosis not improving on outpatient abx.   ED Discharge Orders    None       Renne CriglerGeiple, Kerri Asche, Cordelia Poche-C 12/05/16 1354    Gerhard MunchLockwood, Robert, MD 12/06/16 [phone]

## 2016-12-06 LAB — GLUCOSE, CAPILLARY
GLUCOSE-CAPILLARY: 81 mg/dL (ref 65–99)
GLUCOSE-CAPILLARY: 70 mg/dL (ref 65–99)
GLUCOSE-CAPILLARY: 86 mg/dL (ref 65–99)
Glucose-Capillary: 90 mg/dL (ref 65–99)

## 2016-12-06 LAB — BASIC METABOLIC PANEL
ANION GAP: 9 (ref 5–15)
BUN: 7 mg/dL (ref 6–20)
CALCIUM: 8.6 mg/dL — AB (ref 8.9–10.3)
CO2: 23 mmol/L (ref 22–32)
Chloride: 103 mmol/L (ref 101–111)
Creatinine, Ser: 0.64 mg/dL (ref 0.44–1.00)
GLUCOSE: 94 mg/dL (ref 65–99)
Potassium: 3.6 mmol/L (ref 3.5–5.1)
SODIUM: 135 mmol/L (ref 135–145)

## 2016-12-06 LAB — CBC
HCT: 32.5 % — ABNORMAL LOW (ref 36.0–46.0)
HEMOGLOBIN: 10.5 g/dL — AB (ref 12.0–15.0)
MCH: 27.3 pg (ref 26.0–34.0)
MCHC: 32.3 g/dL (ref 30.0–36.0)
MCV: 84.6 fL (ref 78.0–100.0)
Platelets: 364 10*3/uL (ref 150–400)
RBC: 3.84 MIL/uL — AB (ref 3.87–5.11)
RDW: 15.5 % (ref 11.5–15.5)
WBC: 11.4 10*3/uL — AB (ref 4.0–10.5)

## 2016-12-06 MED ORDER — BUTALBITAL-APAP-CAFFEINE 50-325-40 MG PO TABS
1.0000 | ORAL_TABLET | Freq: Four times a day (QID) | ORAL | Status: DC | PRN
  Administered 2016-12-06 – 2016-12-11 (×4): 1 via ORAL
  Filled 2016-12-06 (×4): qty 1

## 2016-12-06 MED ORDER — ENOXAPARIN SODIUM 40 MG/0.4ML ~~LOC~~ SOLN
40.0000 mg | SUBCUTANEOUS | Status: DC
  Administered 2016-12-06 – 2016-12-15 (×9): 40 mg via SUBCUTANEOUS
  Filled 2016-12-06 (×8): qty 0.4

## 2016-12-06 NOTE — Progress Notes (Signed)
PROGRESS NOTE  Meghan AngerJoanna Wallace ZOX:[id number] DOB: 03/30/1975 DOA: 12/05/2016 PCP: Jeannie DoneMetz, Louise, MD  HPI/Recap of past 24 hours:  Report left leg feeling better, fever down She report headache Husband at bedside  Assessment/Plan: Principal Problem:   Cellulitis Active Problems:   Severe obesity (BMI >= 40) (HCC)   PCOS (polycystic ovarian syndrome)   Hyperlipidemia   Thyroid disease   left Hell[place] extremity Cellulitis- PCN allergic (hives),  Failed outpatient oral abx treatment, negative DVT by venous doppler at urgent care -Extensive circumferential erythema >80% left Scouten leg on presentation, with fever and leukocytosis -She had a callus on left heel that she tried to shave off which is the likely point of entry -She is started on  clindamycin IV and elevate leg.  Broaden abx if no response and improvement in the next 24 hours.  Nontoxic.       Severe obesity (BMI >= 40) (HCC)- Body mass index is 44.39 kg/m.    PCOS (polycystic ovarian syndrome)- holding metformin, blood glucose stable.     Hyperlipidemia- cont home med    hypoThyroidism- cont home med  Cough variant asthma: stable , no wheezing  Migraine headache Trial of fioricet      Code Status: full  Family Communication: patient and husband   Disposition Plan: home in 1-2 days   Consultants:  none  Procedures:  none  Antibiotics:  Cleocin    Objective: BP (!) 114/53 (BP Location: Right Arm)   Pulse 82   Temp 99.3 F (37.4 C) (Oral)   Resp 16   Ht 5\' 6"  (1.676 m)   Wt 124.7 kg (275 lb)   SpO2 100%   BMI 44.39 kg/m   Intake/Output Summary (Last 24 hours) at 12/06/2016 1741 Last data filed at 12/06/2016 1343 Gross per 24 hour  Intake 960 ml  Output -  Net 960 ml   Filed Weights   12/05/16 0932  Weight: 124.7 kg (275 lb)    Exam: Patient is examined daily including today on 12/06/2016, exams remain the same as of yesterday except that has changed    General:   NAD  Cardiovascular: RRR  Respiratory: CTABL  Abdomen: Soft/ND/NT, positive BS  Musculoskeletal: left [place] extremity erythematous, warm and tender to touch  Neuro: alert, oriented   Data Reviewed: Basic Metabolic Panel: Recent Labs  Lab 12/05/16 1021 12/06/16 0544  NA 134* 135  K 3.5 3.6  CL 102 103  CO2 22 23  GLUCOSE 104* 94  BUN 8 7  CREATININE 0.72 0.64  CALCIUM 9.1 8.6*   Liver Function Tests: No results for input(s): AST, ALT, ALKPHOS, BILITOT, PROT, ALBUMIN in the last 168 hours. No results for input(s): LIPASE, AMYLASE in the last 168 hours. No results for input(s): AMMONIA in the last 168 hours. CBC: Recent Labs  Lab 12/05/16 1021 12/06/16 0544  WBC 13.3* 11.4*  NEUTROABS 10.1*  --   HGB 11.3* 10.5*  HCT 33.4* 32.5*  MCV 83.3 84.6  PLT 352 364   Cardiac Enzymes:   No results for input(s): CKTOTAL, CKMB, CKMBINDEX, TROPONINI in the last 168 hours. BNP (last 3 results) No results for input(s): BNP in the last 8760 hours.  ProBNP (last 3 results) No results for input(s): PROBNP in the last 8760 hours.  CBG: Recent Labs  Lab 12/05/16 1811 12/05/16 2156 12/06/16 0723 12/06/16 1147 12/06/16 1659  GLUCAP 87 91 81 70 86    No results found for this or any previous visit (from the past  240 hour(s)).   Studies: No results found.  Scheduled Meds: . escitalopram  20 mg Oral Daily  . insulin aspart  0-9 Units Subcutaneous TID WC  . levothyroxine  75 mcg Oral QAC breakfast  . sodium chloride flush  3 mL Intravenous Q12H    Continuous Infusions: . sodium chloride 250 mL (12/05/16 1603)  . clindamycin (CLEOCIN) IV Stopped (12/06/16 1324)     Time spent: 25mins I have personally reviewed and interpreted on  12/06/2016 daily labs.  I reviewed all nursing notes, pharmacy notes, vitals, pertinent old records  I have discussed plan of care as described above with RN , patient and family on 12/06/2016   Albertine GratesFang Fredi Hurtado MD, PhD  [facility] Pager [phone]. If 7PM-7AM, please contact night-coverage at [email or web address], password [email or web address] 12/06/2016, 5:41 PM  LOS: 1 day

## 2016-12-07 DIAGNOSIS — L0390 Cellulitis, unspecified: Secondary | ICD-10-CM

## 2016-12-07 LAB — BASIC METABOLIC PANEL
Anion gap: 9 (ref 5–15)
BUN: 8 mg/dL (ref 6–20)
CHLORIDE: 104 mmol/L (ref 101–111)
CO2: 26 mmol/L (ref 22–32)
CREATININE: 0.69 mg/dL (ref 0.44–1.00)
Calcium: 9.1 mg/dL (ref 8.9–10.3)
GFR calc Af Amer: >60 mL/min (ref >60)
GFR calc non Af Amer: >60 mL/min (ref >60)
GLUCOSE: 107 mg/dL — AB (ref 65–99)
Potassium: 3.9 mmol/L (ref 3.5–5.1)
SODIUM: 139 mmol/L (ref 135–145)

## 2016-12-07 LAB — CBC WITH DIFFERENTIAL/PLATELET
BASOS PCT: 1 %
Basophils Absolute: 0.1 10*3/uL (ref 0.0–0.1)
EOS ABS: 0.3 10*3/uL (ref 0.0–0.7)
EOS PCT: 3 %
HCT: 32.2 % — ABNORMAL LOW (ref 36.0–46.0)
HEMOGLOBIN: 10.5 g/dL — AB (ref 12.0–15.0)
LYMPHS PCT: 28 %
Lymphs Abs: 2.9 10*3/uL (ref 0.7–4.0)
MCH: 27.5 pg (ref 26.0–34.0)
MCHC: 32.6 g/dL (ref 30.0–36.0)
MCV: 84.3 fL (ref 78.0–100.0)
MONO ABS: 0.8 10*3/uL (ref 0.1–1.0)
Monocytes Relative: 8 %
NEUTROS ABS: 6.3 10*3/uL (ref 1.7–7.7)
Neutrophils Relative %: 60 %
Platelets: 396 10*3/uL (ref 150–400)
RBC: 3.82 MIL/uL — ABNORMAL LOW (ref 3.87–5.11)
RDW: 15.2 % (ref 11.5–15.5)
WBC: 10.4 10*3/uL (ref 4.0–10.5)

## 2016-12-07 LAB — GLUCOSE, CAPILLARY
GLUCOSE-CAPILLARY: 105 mg/dL — AB (ref 65–99)
GLUCOSE-CAPILLARY: 141 mg/dL — AB (ref 65–99)
Glucose-Capillary: 84 mg/dL (ref 65–99)
Glucose-Capillary: 78 mg/dL (ref 65–99)

## 2016-12-07 LAB — LACTIC ACID, PLASMA: Lactic Acid, Venous: 1 mmol/L (ref 0.5–1.9)

## 2016-12-07 LAB — MAGNESIUM: MAGNESIUM: 2.1 mg/dL (ref 1.7–2.4)

## 2016-12-07 LAB — MRSA PCR SCREENING: MRSA by PCR: NEGATIVE

## 2016-12-07 MED ORDER — DIPHENHYDRAMINE HCL 50 MG/ML IJ SOLN
12.5000 mg | Freq: Once | INTRAMUSCULAR | Status: AC
  Administered 2016-12-07: 14:00:00 12.5 mg via INTRAVENOUS
  Filled 2016-12-07: qty 1

## 2016-12-07 MED ORDER — VANCOMYCIN HCL IN DEXTROSE 1-5 GM/200ML-% IV SOLN: 1000.0000 mg | Freq: Once | INTRAVENOUS | Status: DC

## 2016-12-07 MED ORDER — SODIUM CHLORIDE 0.9 % IV SOLN
1.0000 g | Freq: Three times a day (TID) | INTRAVENOUS | Status: DC
  Administered 2016-12-07 – 2016-12-15 (×23): 1 g via INTRAVENOUS
  Filled 2016-12-07 (×26): qty 1

## 2016-12-07 MED ORDER — VANCOMYCIN HCL 10 G IV SOLR
2000.0000 mg | Freq: Once | INTRAVENOUS | Status: AC
  Administered 2016-12-07: 12:00:00 2000 mg via INTRAVENOUS
  Filled 2016-12-07: qty 2000

## 2016-12-07 MED ORDER — VANCOMYCIN HCL IN DEXTROSE 1-5 GM/200ML-% IV SOLN
1000.0000 mg | Freq: Two times a day (BID) | INTRAVENOUS | Status: DC
  Administered 2016-12-07 – 2016-12-11 (×8): 1000 mg via INTRAVENOUS
  Filled 2016-12-07 (×8): qty 200

## 2016-12-07 MED ORDER — METHYLPREDNISOLONE SODIUM SUCC 125 MG IJ SOLR
60.0000 mg | Freq: Once | INTRAMUSCULAR | Status: AC
  Administered 2016-12-07: 14:00:00 60 mg via INTRAVENOUS
  Filled 2016-12-07: qty 2

## 2016-12-07 MED ORDER — SODIUM CHLORIDE 0.9 % IV SOLN
1.0000 g | Freq: Once | INTRAVENOUS | Status: AC
  Administered 2016-12-07: 11:00:00 1 g via INTRAVENOUS
  Filled 2016-12-07: qty 1

## 2016-12-07 NOTE — Progress Notes (Signed)
Pharmacy Antibiotic Note  Meghan AngerJoanna Wallace is a 41 y.o. female on bactrim PTA for left LE cellulitis, presented to the ED on 12/05/2016 with c/o fever and leg pain.  Patient was started on clindamycin on admission.  To change abx to merrem and vancomycin d/t worsening of cellulitis.  Today, 12/07/2016: - afeb, wbc 10.4 - scr 0.69 (crcl>100)  Plan: - vancomycin 2000 mg IV x1, then 1000 mg IV q12h for est AUC 406 - merrem 1gm IV q8h.  Monitor closely for any s/s of allergic rxns ___________________________________  Height: 5\' 6"  (167.6 cm) Weight: 275 lb (124.7 kg) IBW/kg (Calculated) : 59.3  Temp (24hrs), Avg:99 F (37.2 C), Min:98.6 F (37 C), Max:99.3 F (37.4 C)  Recent Labs  Lab 12/05/16 1021 12/05/16 1024 12/06/16 0544 12/07/16 0543  WBC 13.3*  --  11.4* 10.4  CREATININE 0.72  --  0.64 0.69  LATICACIDVEN  --  0.69  --  1.0    Estimated Creatinine Clearance: 126.2 mL/min (by C-G formula based on SCr of 0.69 mg/dL).    Allergies  Allergen Reactions  . Other     Other reaction(s): Other (See Comments) Fresh fruits ( apples, pears, and plums ) makes throat itch  . Penicillin G Hives    Has patient had a PCN reaction causing immediate rash, facial/tongue/throat swelling, SOB or lightheadedness with hypotension: {yes Has patient had a PCN reaction causing severe rash involving mucus membranes or skin necrosis: no Has patient had a PCN reaction that required hospitalization: no Has patient had a PCN reaction occurring within the last 10 years: yes If all of the above answers are "NO", then may proceed with Cephalosporin use.     Antimicrobials this admission:  12/1 clinda>> 12/3 12/3 merrem>> 12/3 vanc>>  Dose adjustments this admission:  --   Microbiology results:  No cx  Thank you for allowing pharmacy to be a part of this patient's care.  Lucia Gaskinsham, Ayaat Jansma P 12/07/2016 10:09 AM

## 2016-12-07 NOTE — Progress Notes (Signed)
PROGRESS NOTE  Meghan AngerJoanna Wallace ZOX:[id number] DOB: 06/20/1975 DOA: 12/05/2016 PCP: Jeannie DoneMetz, Louise, MD  HPI/Recap of past 24 hours:  tmax 99.3, report left leg erythema is worsening and extending down to ankle and posterior leg She report headache is better sister at bedside  Assessment/Plan: Principal Problem:   Cellulitis Active Problems:   Severe obesity (BMI >= 40) (HCC)   PCOS (polycystic ovarian syndrome)   Hyperlipidemia   Thyroid disease   left Derwin extremity Cellulitis- PCN allergic (hives),  Failed outpatient oral abx treatment, negative DVT by venous doppler at urgent care -Extensive circumferential erythema >80% left Ainsley leg on presentation, with fever and leukocytosis -She had a callus on left heel that she tried to shave off which is the likely point of entry -She is started on  clindamycin IV and elevate leg.  Broaden abx to vanc/meropenem due to worsening of cellulitis, consider CT leg is not improving on vanc/meropenem. Patient is  Nontoxic.       Severe obesity (BMI >= 40) (HCC)- Body mass index is 44.39 kg/m.    PCOS (polycystic ovarian syndrome)- holding metformin, blood glucose stable.     Hyperlipidemia- cont home med    hypoThyroidism- cont home med  Cough variant asthma: stable , no wheezing  Migraine headache Trial of fioricet      Code Status: full  Family Communication: patient and sister  Disposition Plan: home when cellulitis improves   Consultants:  none  Procedures:  none  Antibiotics:  Cleocin  From admission to 12/3  Vanc/emropenem from 12/3   Objective: BP (!) 124/55 (BP Location: Right Arm)   Pulse 74   Temp 98.6 F (37 C) (Oral)   Resp 16   Ht 5\' 6"  (1.676 m)   Wt 124.7 kg (275 lb)   SpO2 97%   BMI 44.39 kg/m   Intake/Output Summary (Last 24 hours) at 12/07/2016 0939 Last data filed at 12/07/2016 0755 Gross per 24 hour  Intake 1250 ml  Output -  Net 1250 ml   Filed Weights   12/05/16 0932  Weight:  124.7 kg (275 lb)    Exam: Patient is examined daily including today on 12/07/2016, exams remain the same as of yesterday except that has changed    General:  NAD  Cardiovascular: RRR  Respiratory: CTABL  Abdomen: Soft/ND/NT, positive BS  Musculoskeletal: left Rathod extremity erythematous, warm and tender to touch  Neuro: alert, oriented   Data Reviewed: Basic Metabolic Panel: Recent Labs  Lab 12/05/16 1021 12/06/16 0544 12/07/16 0543  NA 134* 135 139  K 3.5 3.6 3.9  CL 102 103 104  CO2 22 23 26   GLUCOSE 104* 94 107*  BUN 8 7 8   CREATININE 0.72 0.64 0.69  CALCIUM 9.1 8.6* 9.1  MG  --   --  2.1   Liver Function Tests: No results for input(s): AST, ALT, ALKPHOS, BILITOT, PROT, ALBUMIN in the last 168 hours. No results for input(s): LIPASE, AMYLASE in the last 168 hours. No results for input(s): AMMONIA in the last 168 hours. CBC: Recent Labs  Lab 12/05/16 1021 12/06/16 0544 12/07/16 0543  WBC 13.3* 11.4* 10.4  NEUTROABS 10.1*  --  6.3  HGB 11.3* 10.5* 10.5*  HCT 33.4* 32.5* 32.2*  MCV 83.3 84.6 84.3  PLT 352 364 396   Cardiac Enzymes:   No results for input(s): CKTOTAL, CKMB, CKMBINDEX, TROPONINI in the last 168 hours. BNP (last 3 results) No results for input(s): BNP in the last 8760 hours.  ProBNP (last 3 results) No results for input(s): PROBNP in the last 8760 hours.  CBG: Recent Labs  Lab 12/06/16 0723 12/06/16 1147 12/06/16 1659 12/06/16 2126 12/07/16 0708  GLUCAP 81 70 86 90 84    No results found for this or any previous visit (from the past 240 hour(s)).   Studies: No results found.  Scheduled Meds: . enoxaparin (LOVENOX) injection  40 mg Subcutaneous Q24H  . escitalopram  20 mg Oral Daily  . insulin aspart  0-9 Units Subcutaneous TID WC  . levothyroxine  75 mcg Oral QAC breakfast  . sodium chloride flush  3 mL Intravenous Q12H    Continuous Infusions: . sodium chloride 250 mL (12/05/16 1603)  . meropenem (MERREM) IV    .  vancomycin       Time spent: 25mins I have personally reviewed and interpreted on  12/07/2016 daily labs.  I reviewed all nursing notes, pharmacy notes, vitals, pertinent old records  I have discussed plan of care as described above with RN , patient and family on 12/07/2016   Albertine GratesFang Graysen Depaula MD, PhD  [facility] Pager [phone]. If 7PM-7AM, please contact night-coverage at [email or web address], password [facility] 12/07/2016, 9:39 AM  LOS: 2 days

## 2016-12-08 ENCOUNTER — Inpatient Hospital Stay (HOSPITAL_COMMUNITY): Payer: BLUE CROSS/BLUE SHIELD

## 2016-12-08 LAB — BASIC METABOLIC PANEL
Anion gap: 8 (ref 5–15)
BUN: 9 mg/dL (ref 6–20)
CALCIUM: 9.1 mg/dL (ref 8.9–10.3)
CHLORIDE: 105 mmol/L (ref 101–111)
CO2: 24 mmol/L (ref 22–32)
CREATININE: 0.54 mg/dL (ref 0.44–1.00)
GFR calc Af Amer: >60 mL/min (ref >60)
GFR calc non Af Amer: >60 mL/min (ref >60)
Glucose, Bld: 155 mg/dL — ABNORMAL HIGH (ref 65–99)
Potassium: 4.1 mmol/L (ref 3.5–5.1)
Sodium: 137 mmol/L (ref 135–145)

## 2016-12-08 LAB — CBC WITH DIFFERENTIAL/PLATELET
BASOS PCT: 1 %
Basophils Absolute: 0.1 10*3/uL (ref 0.0–0.1)
EOS ABS: 0 10*3/uL (ref 0.0–0.7)
Eosinophils Relative: 0 %
HEMATOCRIT: 35.4 % — AB (ref 36.0–46.0)
HEMOGLOBIN: 11.7 g/dL — AB (ref 12.0–15.0)
LYMPHS ABS: 3.1 10*3/uL (ref 0.7–4.0)
LYMPHS PCT: 24 %
MCH: 28.3 pg (ref 26.0–34.0)
MCHC: 33.1 g/dL (ref 30.0–36.0)
MCV: 85.7 fL (ref 78.0–100.0)
MONOS PCT: 4 %
Monocytes Absolute: 0.5 10*3/uL (ref 0.1–1.0)
NEUTROS ABS: 9.2 10*3/uL — AB (ref 1.7–7.7)
Neutrophils Relative %: 71 %
Platelets: 348 10*3/uL (ref 150–400)
RBC: 4.13 MIL/uL (ref 3.87–5.11)
RDW: 15.4 % (ref 11.5–15.5)
WBC: 12.9 10*3/uL — ABNORMAL HIGH (ref 4.0–10.5)

## 2016-12-08 LAB — SEDIMENTATION RATE: Sed Rate: 85 mm/hr — ABNORMAL HIGH (ref 0–22)

## 2016-12-08 LAB — C-REACTIVE PROTEIN: CRP: 9.3 mg/dL — ABNORMAL HIGH (ref <1.0)

## 2016-12-08 LAB — GLUCOSE, CAPILLARY
GLUCOSE-CAPILLARY: 122 mg/dL — AB (ref 65–99)
GLUCOSE-CAPILLARY: 77 mg/dL (ref 65–99)
GLUCOSE-CAPILLARY: 80 mg/dL (ref 65–99)
Glucose-Capillary: 85 mg/dL (ref 65–99)

## 2016-12-08 LAB — LACTIC ACID, PLASMA: Lactic Acid, Venous: 1.6 mmol/L (ref 0.5–1.9)

## 2016-12-08 MED ORDER — DIPHENHYDRAMINE HCL 50 MG/ML IJ SOLN: 12.5000 mg | Freq: Four times a day (QID) | INTRAMUSCULAR | Status: DC | PRN

## 2016-12-08 MED ORDER — GADOBENATE DIMEGLUMINE 529 MG/ML IV SOLN
20.0000 mL | Freq: Once | INTRAVENOUS | Status: AC | PRN
  Administered 2016-12-08: 20 mL via INTRAVENOUS

## 2016-12-08 MED ORDER — LIP MEDEX EX OINT
TOPICAL_OINTMENT | CUTANEOUS | Status: AC
  Filled 2016-12-08: qty 7

## 2016-12-08 MED ORDER — MENTHOL 3 MG MT LOZG
1.0000 | LOZENGE | OROMUCOSAL | Status: DC | PRN
  Administered 2016-12-08: 05:00:00 3 mg via ORAL
  Filled 2016-12-08: qty 9

## 2016-12-08 NOTE — Progress Notes (Signed)
PROGRESS NOTE  Meghan Wallace [id number] DOB: 12/28/1975 DOA: 12/05/2016 PCP: Jeannie DoneMetz, Louise, MD  HPI/Recap of past 24 hours:  Persistent left leg pain, fever subsided She report headache has resolved sister at bedside  Assessment/Plan: Principal Problem:   Cellulitis Active Problems:   Severe obesity (BMI >= 40) (HCC)   PCOS (polycystic ovarian syndrome)   Hyperlipidemia   Thyroid disease   left Peretz extremity Cellulitis- PCN allergic (hives),  Failed outpatient oral abx treatment, negative DVT by venous doppler at urgent care -Extensive circumferential erythema >80% left Poppen leg on presentation, with fever and leukocytosis. Patient is  Nontoxic appearing.  -She had a callus on left heel that she tried to shave off which is the likely point of entry -She is started on  clindamycin IV and elevate leg.  Broaden abx to vanc/meropenem due to worsening of cellulitis,  -continued left leg pain/erythema after broaden abx, mri left leg showed "1. Left [place] leg cellulitis. 5 x 8 x 37 mm abscess in the distal left anterolateral Dobransky leg at the level of the ankle joint." -ortho Dr August Saucerean consulted.   Redman syndrome with vanc:  -She developed redness around face/neck with first vanc infusion. She received once dose of solumedrol and benadryl. -she tolerated subsequent vanc infusion when infusion rate slowed down. Continue iv vanc as tolerated, prn benadryl.     Severe obesity (BMI >= 40) (HCC)- Body mass index is 44.39 kg/m.  PCOS (polycystic ovarian syndrome)- holding metformin, blood glucose stable.   Hyperlipidemia- cont home med  hypoThyroidism- cont home med  Cough variant asthma: stable , no wheezing  Migraine headache Trial of fioricet  Resolved.      Code Status: full  Family Communication: patient and sister  Disposition Plan: home when cellulitis improves   Consultants:  Ortho Dr August Saucerean  Procedures:  none  Antibiotics:  Cleocin  From admission to  12/3  Vanc/emropenem from 12/3   Objective: BP (!) 114/45 (BP Location: Right Arm)   Pulse 63   Temp 98 F (36.7 C) (Oral)   Resp 14   Ht 5\' 6"  (1.676 m)   Wt 124.7 kg (275 lb)   SpO2 100%   BMI 44.39 kg/m   Intake/Output Summary (Last 24 hours) at 12/08/2016 1643 Last data filed at 12/08/2016 1400 Gross per 24 hour  Intake 2338.33 ml  Output -  Net 2338.33 ml   Filed Weights   12/05/16 0932  Weight: 124.7 kg (275 lb)    Exam: Patient is examined daily including today on 12/08/2016, exams remain the same as of yesterday except that has changed    General:  NAD  Cardiovascular: RRR  Respiratory: CTABL  Abdomen: Soft/ND/NT, positive BS  Musculoskeletal: left Demetro extremity erythematous, warm and tender to touch  Neuro: alert, oriented   Data Reviewed: Basic Metabolic Panel: Recent Labs  Lab 12/05/16 1021 12/06/16 0544 12/07/16 0543 12/08/16 0533  NA 134* 135 139 137  K 3.5 3.6 3.9 4.1  CL 102 103 104 105  CO2 22 23 26 24   GLUCOSE 104* 94 107* 155*  BUN 8 7 8 9   CREATININE 0.72 0.64 0.69 0.54  CALCIUM 9.1 8.6* 9.1 9.1  MG  --   --  2.1  --    Liver Function Tests: No results for input(s): AST, ALT, ALKPHOS, BILITOT, PROT, ALBUMIN in the last 168 hours. No results for input(s): LIPASE, AMYLASE in the last 168 hours. No results for input(s): AMMONIA in the last 168 hours. CBC:  Recent Labs  Lab 12/05/16 1021 12/06/16 0544 12/07/16 0543 12/08/16 0533  WBC 13.3* 11.4* 10.4 12.9*  NEUTROABS 10.1*  --  6.3 9.2*  HGB 11.3* 10.5* 10.5* 11.7*  HCT 33.4* 32.5* 32.2* 35.4*  MCV 83.3 84.6 84.3 85.7  PLT 352 364 396 348   Cardiac Enzymes:   No results for input(s): CKTOTAL, CKMB, CKMBINDEX, TROPONINI in the last 168 hours. BNP (last 3 results) No results for input(s): BNP in the last 8760 hours.  ProBNP (last 3 results) No results for input(s): PROBNP in the last 8760 hours.  CBG: Recent Labs  Lab 12/07/16 1718 12/07/16 2214 12/08/16 0725  12/08/16 1143 12/08/16 1638  GLUCAP 105* 141* 85 122* 77    Recent Results (from the past 240 hour(s))  MRSA PCR Screening     Status: None   Collection Time: 12/07/16  1:40 PM  Result Value Ref Range Status   MRSA by PCR NEGATIVE NEGATIVE Final    Comment:        The GeneXpert MRSA Assay (FDA approved for NASAL specimens only), is one component of a comprehensive MRSA colonization surveillance program. It is not intended to diagnose MRSA infection nor to guide or monitor treatment for MRSA infections.      Studies: Mr Tibia Fibula Left W Wo Contrast  Result Date: 12/08/2016 CLINICAL DATA:  Soft tissue swelling of the left [place] leg. EXAM: MRI OF [place] LEFT EXTREMITY WITHOUT AND WITH CONTRAST TECHNIQUE: Multiplanar, multisequence MR imaging of the left [place]ier leg was performed both before and after administration of intravenous contrast. CONTRAST:  20mL MULTIHANCE GADOBENATE DIMEGLUMINE 529 MG/ML IV SOLN COMPARISON:  None. FINDINGS: Bones/Joint/Cartilage No marrow signal abnormality. No fracture or dislocation. Normal alignment. No joint effusion. No periosteal reaction or bone destruction. Ligaments Collateral ligaments are intact. Muscles and Tendons Muscles are normal. No muscle edema or muscle atrophy. No intramuscular fluid collection or hematoma. Soft tissue Soft tissue edema in the subcutaneous fat circumferentially around the left Debois leg with mild heterogeneous enhancement most concerning for cellulitis. 5 x 8 x 37 mm peripheral enhancing fluid collection in the distal left anterolateral Bethea leg at the level of the ankle joint concerning for a small abscess. IMPRESSION: 1. Left Schmit leg cellulitis. 5 x 8 x 37 mm abscess in the distal left anterolateral Hineman leg at the level of the ankle joint. Electronically Signed   By: Elige KoHetal  Patel   On: 12/08/2016 15:24    Scheduled Meds: . enoxaparin (LOVENOX) injection  40 mg Subcutaneous Q24H  . escitalopram  20 mg Oral Daily  .  insulin aspart  0-9 Units Subcutaneous TID WC  . levothyroxine  75 mcg Oral QAC breakfast  . sodium chloride flush  3 mL Intravenous Q12H    Continuous Infusions: . sodium chloride 250 mL (12/05/16 1603)  . meropenem (MERREM) IV Stopped (12/08/16 1353)  . vancomycin Stopped (12/08/16 1323)     Time spent: 35mins, case discussed with orthopedics Dr August Saucerean I have personally reviewed and interpreted on  12/08/2016 daily labs.  I reviewed all nursing notes, pharmacy notes, vitals, pertinent old records  I have discussed plan of care as described above with RN , patient and family on 12/08/2016   Albertine GratesFang Moishy Laday MD, PhD  [facility] Pager [phone]. If 7PM-7AM, please contact night-coverage at [email or web address], password [email or web address] 12/08/2016, 4:43 PM  LOS: 3 days

## 2016-12-08 NOTE — Consult Note (Signed)
Reason for Consult: Left leg infection Referring Physician: Dr.xu  Meghan Wallace is an 41 y.o. female.  HPI: Meghan Wallace is a 41 year old female with a 5-day history of left leg cellulitis.  She has been on IV antibiotics.  She states tonight that she is much improved compared to yesterday.  She has not had fevers since Saturday.  MRI scan was performed and read out as showing possible abscess in the posterior lateral aspect of the leg near the ankle joint.  I reviewed the scan myself and also examined the patient.  She is able to ambulate.  Past Medical History:  Diagnosis Date  . Depression   . Hyperlipidemia   . PCOS (polycystic ovarian syndrome)   . Thyroid disease    hypothyroidism    Past Surgical History:  Procedure Laterality Date  . BREAST SURGERY     rt lumpectomy 2014  . microdiskectomy      History reviewed. No pertinent family history.  Social History:  reports that  has never smoked. she has never used smokeless tobacco. She reports that she does not drink alcohol or use drugs.  Allergies:  Allergies  Allergen Reactions  . Other     Other reaction(s): Other (See Comments) Fresh fruits ( apples, pears, and plums ) makes throat itch  . Penicillin G Hives    Has patient had a PCN reaction causing immediate rash, facial/tongue/throat swelling, SOB or lightheadedness with hypotension: {yes Has patient had a PCN reaction causing severe rash involving mucus membranes or skin necrosis: no Has patient had a PCN reaction that required hospitalization: no Has patient had a PCN reaction occurring within the last 10 years: yes If all of the above answers are "NO", then may proceed with Cephalosporin use.     Medications: I have reviewed the patient's current medications.  Results for orders placed or performed during the hospital encounter of 12/05/16 (from the past 48 hour(s))  Glucose, capillary     Status: None   Collection Time: 12/06/16  9:26 PM  Result Value Ref Range    Glucose-Capillary 90 65 - 99 mg/dL  CBC with Differential/Platelet     Status: Abnormal   Collection Time: 12/07/16  5:43 AM  Result Value Ref Range   WBC 10.4 4.0 - 10.5 K/uL   RBC 3.82 (L) 3.87 - 5.11 MIL/uL   Hemoglobin 10.5 (L) 12.0 - 15.0 g/dL   HCT 32.2 (L) 36.0 - 46.0 %   MCV 84.3 78.0 - 100.0 fL   MCH 27.5 26.0 - 34.0 pg   MCHC 32.6 30.0 - 36.0 g/dL   RDW 15.2 11.5 - 15.5 %   Platelets 396 150 - 400 K/uL   Neutrophils Relative % 60 %   Lymphocytes Relative 28 %   Monocytes Relative 8 %   Eosinophils Relative 3 %   Basophils Relative 1 %   Neutro Abs 6.3 1.7 - 7.7 K/uL   Lymphs Abs 2.9 0.7 - 4.0 K/uL   Monocytes Absolute 0.8 0.1 - 1.0 K/uL   Eosinophils Absolute 0.3 0.0 - 0.7 K/uL   Basophils Absolute 0.1 0.0 - 0.1 K/uL   WBC Morphology MILD LEFT SHIFT (1-5% METAS, OCC MYELO, OCC BANDS)     Comment: TOXIC GRANULATION  Basic metabolic panel     Status: Abnormal   Collection Time: 12/07/16  5:43 AM  Result Value Ref Range   Sodium 139 135 - 145 mmol/L   Potassium 3.9 3.5 - 5.1 mmol/L   Chloride 104 101 -  111 mmol/L   CO2 26 22 - 32 mmol/L   Glucose, Bld 107 (H) 65 - 99 mg/dL   BUN 8 6 - 20 mg/dL   Creatinine, Ser 0.69 0.44 - 1.00 mg/dL   Calcium 9.1 8.9 - 10.3 mg/dL   GFR calc non Af Amer >60 >60 mL/min   GFR calc Af Amer >60 >60 mL/min    Comment: (NOTE) The eGFR has been calculated using the CKD EPI equation. This calculation has not been validated in all clinical situations. eGFR's persistently <60 mL/min signify possible Chronic Kidney Disease.    Anion gap 9 5 - 15  Magnesium     Status: None   Collection Time: 12/07/16  5:43 AM  Result Value Ref Range   Magnesium 2.1 1.7 - 2.4 mg/dL  Lactic acid, plasma     Status: None   Collection Time: 12/07/16  5:43 AM  Result Value Ref Range   Lactic Acid, Venous 1.0 0.5 - 1.9 mmol/L  Glucose, capillary     Status: None   Collection Time: 12/07/16  7:08 AM  Result Value Ref Range   Glucose-Capillary 84 65 -  99 mg/dL  Glucose, capillary     Status: None   Collection Time: 12/07/16 11:46 AM  Result Value Ref Range   Glucose-Capillary 78 65 - 99 mg/dL  MRSA PCR Screening     Status: None   Collection Time: 12/07/16  1:40 PM  Result Value Ref Range   MRSA by PCR NEGATIVE NEGATIVE    Comment:        The GeneXpert MRSA Assay (FDA approved for NASAL specimens only), is one component of a comprehensive MRSA colonization surveillance program. It is not intended to diagnose MRSA infection nor to guide or monitor treatment for MRSA infections.   Glucose, capillary     Status: Abnormal   Collection Time: 12/07/16  5:18 PM  Result Value Ref Range   Glucose-Capillary 105 (H) 65 - 99 mg/dL  Glucose, capillary     Status: Abnormal   Collection Time: 12/07/16 10:14 PM  Result Value Ref Range   Glucose-Capillary 141 (H) 65 - 99 mg/dL  CBC with Differential/Platelet     Status: Abnormal   Collection Time: 12/08/16  5:33 AM  Result Value Ref Range   WBC 12.9 (H) 4.0 - 10.5 K/uL    Comment: WHITE COUNT CONFIRMED ON SMEAR   RBC 4.13 3.87 - 5.11 MIL/uL   Hemoglobin 11.7 (L) 12.0 - 15.0 g/dL   HCT 35.4 (L) 36.0 - 46.0 %   MCV 85.7 78.0 - 100.0 fL   MCH 28.3 26.0 - 34.0 pg   MCHC 33.1 30.0 - 36.0 g/dL   RDW 15.4 11.5 - 15.5 %   Platelets 348 150 - 400 K/uL    Comment: REPEATED TO VERIFY SPECIMEN CHECKED FOR CLOTS PLATELET COUNT CONFIRMED BY SMEAR    Neutrophils Relative % 71 %   Lymphocytes Relative 24 %   Monocytes Relative 4 %   Eosinophils Relative 0 %   Basophils Relative 1 %   Neutro Abs 9.2 (H) 1.7 - 7.7 K/uL   Lymphs Abs 3.1 0.7 - 4.0 K/uL   Monocytes Absolute 0.5 0.1 - 1.0 K/uL   Eosinophils Absolute 0.0 0.0 - 0.7 K/uL   Basophils Absolute 0.1 0.0 - 0.1 K/uL   WBC Morphology MILD LEFT SHIFT (1-5% METAS, OCC MYELO, OCC BANDS)     Comment: TOXIC GRANULATION  Basic metabolic panel     Status: Abnormal  Collection Time: 12/08/16  5:33 AM  Result Value Ref Range   Sodium 137 135 -  145 mmol/L   Potassium 4.1 3.5 - 5.1 mmol/L   Chloride 105 101 - 111 mmol/L   CO2 24 22 - 32 mmol/L   Glucose, Bld 155 (H) 65 - 99 mg/dL   BUN 9 6 - 20 mg/dL   Creatinine, Ser 0.54 0.44 - 1.00 mg/dL   Calcium 9.1 8.9 - 10.3 mg/dL   GFR calc non Af Amer >60 >60 mL/min   GFR calc Af Amer >60 >60 mL/min    Comment: (NOTE) The eGFR has been calculated using the CKD EPI equation. This calculation has not been validated in all clinical situations. eGFR's persistently <60 mL/min signify possible Chronic Kidney Disease.    Anion gap 8 5 - 15  Lactic acid, plasma     Status: None   Collection Time: 12/08/16  5:33 AM  Result Value Ref Range   Lactic Acid, Venous 1.6 0.5 - 1.9 mmol/L  Sedimentation rate     Status: Abnormal   Collection Time: 12/08/16  5:33 AM  Result Value Ref Range   Sed Rate 85 (H) 0 - 22 mm/hr  C-reactive protein     Status: Abnormal   Collection Time: 12/08/16  5:33 AM  Result Value Ref Range   CRP 9.3 (H) <1.0 mg/dL    Comment: Performed at [facility], [place] [address], [place], [place] [address]  Glucose, capillary     Status: None   Collection Time: 12/08/16  7:25 AM  Result Value Ref Range   Glucose-Capillary 85 65 - 99 mg/dL  Glucose, capillary     Status: Abnormal   Collection Time: 12/08/16 11:43 AM  Result Value Ref Range   Glucose-Capillary 122 (H) 65 - 99 mg/dL  Glucose, capillary     Status: None   Collection Time: 12/08/16  4:38 PM  Result Value Ref Range   Glucose-Capillary 77 65 - 99 mg/dL    Mr Tibia Fibula Left W Wo Contrast  Result Date: 12/08/2016 CLINICAL DATA:  Soft tissue swelling of the left [place] leg. EXAM: MRI OF [place] LEFT EXTREMITY WITHOUT AND WITH CONTRAST TECHNIQUE: Multiplanar, multisequence MR imaging of the left Fre[place]ich leg was performed both before and after administration of intravenous contrast. CONTRAST:  46m MULTIHANCE GADOBENATE DIMEGLUMINE 529 MG/ML IV SOLN COMPARISON:  None. FINDINGS: Bones/Joint/Cartilage No marrow  signal abnormality. No fracture or dislocation. Normal alignment. No joint effusion. No periosteal reaction or bone destruction. Ligaments Collateral ligaments are intact. Muscles and Tendons Muscles are normal. No muscle edema or muscle atrophy. No intramuscular fluid collection or hematoma. Soft tissue Soft tissue edema in the subcutaneous fat circumferentially around the left [place]orn leg with mild heterogeneous enhancement most concerning for cellulitis. 5 x 8 x 37 mm peripheral enhancing fluid collection in the distal left anterolateral [place] leg at the level of the ankle joint concerning for a small abscess. IMPRESSION: 1. Left Ericsson leg cellulitis. 5 x 8 x 37 mm abscess in the distal left anterolateral Pankratz leg at the level of the ankle joint. Electronically Signed   By: HKathreen Devoid  On: 12/08/2016 15:24    Review of Systems  Musculoskeletal: Positive for joint pain.  All other systems reviewed and are negative.  Blood pressure (!) 98/49, pulse 65, temperature 98 F (36.7 C), temperature source Oral, resp. rate 14, height 5' 6" (1.676 m), weight 275 lb (124.7 kg), SpO2 100 %. Physical Exam  Constitutional: She appears well-developed.  HENT:  Head: Normocephalic.  Eyes: Pupils are equal, round, and reactive to light.  Neck: Normal range of motion.  Cardiovascular: Normal rate.  Respiratory: Effort normal.  Neurological: She is alert.  Skin: Skin is warm.  Psychiatric: She has a normal mood and affect.  Examination of the left leg demonstrates an area of cellulitis directly posterior at the level of the mid calf as well as some cellulitis anteriorly over the tibia.  I do not detect any tissue crepitus.  Compartments are soft.  Ankle dorsiflexion plantarflexion is intact.  No paresthesias.  No real areas of fluctuance or palpable anywhere around the ankle.  None of these areas are excessively tender.  Assessment/Plan: Impression is left Knieriem extremity cellulitis with no definite drainable  abscess at this time based on a combination of her exam her improvement over the last 24 hours and my review of the MRI scan.  I cannot palpate any type of pointing abscess in the area described on the scan.  I think it would be worth continuing her on IV antibiotics for 24-48 more hours and then letting me recheck her on Thursday.  I do not see an operative indication at this time in the leg but that may change over the next 24-48 hours  G Alphonzo Severance 12/08/2016, 9:11 PM

## 2016-12-09 DIAGNOSIS — L0390 Cellulitis, unspecified: Secondary | ICD-10-CM

## 2016-12-09 DIAGNOSIS — E079 Disorder of thyroid, unspecified: Secondary | ICD-10-CM

## 2016-12-09 LAB — CBC WITH DIFFERENTIAL/PLATELET
BASOS ABS: 0.3 10*3/uL — AB (ref 0.0–0.1)
Basophils Relative: 2 %
EOS PCT: 1 %
Eosinophils Absolute: 0.2 10*3/uL (ref 0.0–0.7)
HEMATOCRIT: 36.5 % (ref 36.0–46.0)
HEMOGLOBIN: 11.8 g/dL — AB (ref 12.0–15.0)
LYMPHS PCT: 34 %
Lymphs Abs: 5.4 10*3/uL — ABNORMAL HIGH (ref 0.7–4.0)
MCH: 28.3 pg (ref 26.0–34.0)
MCHC: 32.3 g/dL (ref 30.0–36.0)
MCV: 87.5 fL (ref 78.0–100.0)
MONOS PCT: 5 %
Monocytes Absolute: 0.8 10*3/uL (ref 0.1–1.0)
Neutro Abs: 9.1 10*3/uL — ABNORMAL HIGH (ref 1.7–7.7)
Neutrophils Relative %: 58 %
Platelets: 573 10*3/uL — ABNORMAL HIGH (ref 150–400)
RBC: 4.17 MIL/uL (ref 3.87–5.11)
RDW: 15.4 % (ref 11.5–15.5)
WBC: 15.8 10*3/uL — AB (ref 4.0–10.5)

## 2016-12-09 LAB — BASIC METABOLIC PANEL
Anion gap: 11 (ref 5–15)
BUN: 10 mg/dL (ref 6–20)
CO2: 25 mmol/L (ref 22–32)
Calcium: 9.1 mg/dL (ref 8.9–10.3)
Chloride: 102 mmol/L (ref 101–111)
Creatinine, Ser: 0.57 mg/dL (ref 0.44–1.00)
GFR calc Af Amer: >60 mL/min (ref >60)
GFR calc non Af Amer: >60 mL/min (ref >60)
Glucose, Bld: 80 mg/dL (ref 65–99)
Potassium: 4 mmol/L (ref 3.5–5.1)
Sodium: 138 mmol/L (ref 135–145)

## 2016-12-09 LAB — GLUCOSE, CAPILLARY
Glucose-Capillary: 71 mg/dL (ref 65–99)
Glucose-Capillary: 86 mg/dL (ref 65–99)
Glucose-Capillary: 75 mg/dL (ref 65–99)
Glucose-Capillary: 81 mg/dL (ref 65–99)

## 2016-12-09 MED ORDER — METFORMIN HCL 500 MG PO TABS
1000.0000 mg | ORAL_TABLET | Freq: Two times a day (BID) | ORAL | Status: DC
  Filled 2016-12-09 (×5): qty 2

## 2016-12-09 MED ORDER — ALUM & MAG HYDROXIDE-SIMETH 200-200-20 MG/5ML PO SUSP
30.0000 mL | Freq: Four times a day (QID) | ORAL | Status: DC | PRN
  Administered 2016-12-09 – 2016-12-13 (×3): 30 mL via ORAL
  Filled 2016-12-09 (×3): qty 30

## 2016-12-09 NOTE — Plan of Care (Signed)
Plan of care discussed with patient.  Dr. August Saucerean, Orthopedics discussed plan with patient.  Patient in agreement with current plan.

## 2016-12-09 NOTE — Progress Notes (Signed)
PROGRESS NOTE  Meghan AngerJoanna Mollett WUJ:[id number] DOB: 06/18/1975 DOA: 12/05/2016 PCP: Jeannie DoneMetz, Louise, MD  HPI/Recap of past 24 hours: Meghan Wallace is a 41 y.o. female with medical history significant of PCOS, obesity, hypothyroidism comes in with over 2 days of worsening left Reppert leg swelling, pain, and redness.  She did have a recent ultrasound which was neg for DVT.  Pt being referred for admission for worsening cellulitis despite attempt at outpatient treatment with bactrim 1 tab BID.  Pt seen and examined at her bedside. She reports her left Treptow extremity pain is well controlled on her pain management. MRI tibia 12/08/16 revealed: Left Gravatt leg cellulitis. 5 x 8 x 37 mm abscess in the distal left anterolateral Pelfrey leg at the level of the ankle joint.   Ortho surgery following and will reassess in the morning for possible I&D if indicated.     Assessment/Plan: Principal Problem:   Cellulitis Active Problems:   Severe obesity (BMI >= 40) (HCC)   PCOS (polycystic ovarian syndrome)   Hyperlipidemia   Thyroid disease  Acute LLE Cellulitis- PCN allergic -meropenem and IV vanc. 4 days of IV antibiotics -MRI left LE:  Left Hoffert leg cellulitis. 5 x 8 x 37 mm abscess in the distal left anterolateral Tritschler leg at the level of the ankle joint. -ortho following -possible I&D tomorrow 12/10/16    Severe obesity (BMI >= 40) -weight loss thru healthy diet and reg exercise -follows with nutritionist in outpatient setting, continue    PCOS (polycystic ovarian syndrome)-  -restart metformin, no aki, ckd or elevated LFTs    Hyperlipidemia-  -cont home med    Thyroid disease - cont home med   Code Status: Full  Family Communication: sister at bedside. alll questions answered to her satisfaction.  Disposition Plan: will stay another midnight for possible I&D Tomorrow 12/10/16   Consultants:  Orthopedic surgery  Procedures:  Nobne  Antimicrobials:  IV meropenem and IV vanc  day # 4 of antibiotics  DVT prophylaxis:  lovenox sq 40 mg daily   Objective: Vitals:   12/08/16 1333 12/08/16 1845 12/08/16 2154 12/09/16 0524  BP: (!) 114/45 (!) 98/49 (!) 111/54 (!) 119/55  Pulse: 63 65 63 64  Resp: 14  15 16   Temp: 98 F (36.7 C)  98 F (36.7 C) 98 F (36.7 C)  TempSrc: Oral  Oral Oral  SpO2: 100%  100% 98%  Weight:      Height:        Intake/Output Summary (Last 24 hours) at 12/09/2016 1012 Last data filed at 12/09/2016 0647 Gross per 24 hour  Intake 1967.83 ml  Output -  Net 1967.83 ml   Filed Weights   12/05/16 0932  Weight: 124.7 kg (275 lb)    Exam:   General:  41 yo CF obese, NAD  Cardiovascular: RRR no rubs or gallops  Respiratory: CTA with no wheezes or rales  Abdomen: obese soft NT ND NBS x4  Musculoskeletal: left Brawley extremity with mid tibia erythema and edema  Skin: as stated above pulses 2/4 in all 4  Psychiatry: Mood is appropriate for condition and setting   Data Reviewed: CBC: Recent Labs  Lab 12/05/16 1021 12/06/16 0544 12/07/16 0543 12/08/16 0533 12/09/16 0607  WBC 13.3* 11.4* 10.4 12.9* 15.8*  NEUTROABS 10.1*  --  6.3 9.2* 9.1*  HGB 11.3* 10.5* 10.5* 11.7* 11.8*  HCT 33.4* 32.5* 32.2* 35.4* 36.5  MCV 83.3 84.6 84.3 85.7 87.5  PLT 352 364 396 348 573*  Basic Metabolic Panel: Recent Labs  Lab 12/05/16 1021 12/06/16 0544 12/07/16 0543 12/08/16 0533 12/09/16 0607  NA 134* 135 139 137 138  K 3.5 3.6 3.9 4.1 4.0  CL 102 103 104 105 102  CO2 22 23 26 24 25   GLUCOSE 104* 94 107* 155* 80  BUN 8 7 8 9 10   CREATININE 0.72 0.64 0.69 0.54 0.57  CALCIUM 9.1 8.6* 9.1 9.1 9.1  MG  --   --  2.1  --   --    GFR: Estimated Creatinine Clearance: 126.2 mL/min (by C-G formula based on SCr of 0.57 mg/dL). Liver Function Tests: No results for input(s): AST, ALT, ALKPHOS, BILITOT, PROT, ALBUMIN in the last 168 hours. No results for input(s): LIPASE, AMYLASE in the last 168 hours. No results for input(s): AMMONIA  in the last 168 hours. Coagulation Profile: No results for input(s): INR, PROTIME in the last 168 hours. Cardiac Enzymes: No results for input(s): CKTOTAL, CKMB, CKMBINDEX, TROPONINI in the last 168 hours. BNP (last 3 results) No results for input(s): PROBNP in the last 8760 hours. HbA1C: No results for input(s): HGBA1C in the last 72 hours. CBG: Recent Labs  Lab 12/08/16 0725 12/08/16 1143 12/08/16 1638 12/08/16 2158 12/09/16 0726  GLUCAP 85 122* 77 80 71   Lipid Profile: No results for input(s): CHOL, HDL, LDLCALC, TRIG, CHOLHDL, LDLDIRECT in the last 72 hours. Thyroid Function Tests: No results for input(s): TSH, T4TOTAL, FREET4, T3FREE, THYROIDAB in the last 72 hours. Anemia Panel: No results for input(s): VITAMINB12, FOLATE, FERRITIN, TIBC, IRON, RETICCTPCT in the last 72 hours. Urine analysis:    Component Value Date/Time   COLORURINE YELLOW 02/27/2009 2025   APPEARANCEUR CLEAR 02/27/2009 2025   LABSPEC <1.005 (L) 02/27/2009 2025   PHURINE 6.0 02/27/2009 2025   GLUCOSEU NEGATIVE 02/27/2009 2025   HGBUR SMALL (A) 02/27/2009 2025   BILIRUBINUR NEGATIVE 02/27/2009 2025   KETONESUR NEGATIVE 02/27/2009 2025   PROTEINUR NEGATIVE 02/27/2009 2025   UROBILINOGEN 0.2 02/27/2009 2025   NITRITE NEGATIVE 02/27/2009 2025   LEUKOCYTESUR NEGATIVE 02/27/2009 2025   Sepsis Labs: @LABRCNTIP (procalcitonin:4,lacticidven:4)  ) Recent Results (from the past 240 hour(s))  MRSA PCR Screening     Status: None   Collection Time: 12/07/16  1:40 PM  Result Value Ref Range Status   MRSA by PCR NEGATIVE NEGATIVE Final    Comment:        The GeneXpert MRSA Assay (FDA approved for NASAL specimens only), is one component of a comprehensive MRSA colonization surveillance program. It is not intended to diagnose MRSA infection nor to guide or monitor treatment for MRSA infections.       Studies: Mr Tibia Fibula Left W Wo Contrast  Result Date: 12/08/2016 CLINICAL DATA:  Soft  tissue swelling of the left Gelpi leg. EXAM: MRI OF Lenzo LEFT EXTREMITY WITHOUT AND WITH CONTRAST TECHNIQUE: Multiplanar, multisequence MR imaging of the left Bordley leg was performed both before and after administration of intravenous contrast. CONTRAST:  20mL MULTIHANCE GADOBENATE DIMEGLUMINE 529 MG/ML IV SOLN COMPARISON:  None. FINDINGS: Bones/Joint/Cartilage No marrow signal abnormality. No fracture or dislocation. Normal alignment. No joint effusion. No periosteal reaction or bone destruction. Ligaments Collateral ligaments are intact. Muscles and Tendons Muscles are normal. No muscle edema or muscle atrophy. No intramuscular fluid collection or hematoma. Soft tissue Soft tissue edema in the subcutaneous fat circumferentially around the left Stigler leg with mild heterogeneous enhancement most concerning for cellulitis. 5 x 8 x 37 mm peripheral enhancing fluid collection in  the distal left anterolateral Sako leg at the level of the ankle joint concerning for a small abscess. IMPRESSION: 1. Left Coston leg cellulitis. 5 x 8 x 37 mm abscess in the distal left anterolateral Hurlbut leg at the level of the ankle joint. Electronically Signed   By: Elige KoHetal  Patel   On: 12/08/2016 15:24    Scheduled Meds: . enoxaparin (LOVENOX) injection  40 mg Subcutaneous Q24H  . escitalopram  20 mg Oral Daily  . insulin aspart  0-9 Units Subcutaneous TID WC  . levothyroxine  75 mcg Oral QAC breakfast  . lip balm      . sodium chloride flush  3 mL Intravenous Q12H    Continuous Infusions: . sodium chloride 250 mL (12/05/16 1603)  . meropenem (MERREM) IV Stopped (12/09/16 0717)  . vancomycin Stopped (12/09/16 0134)     LOS: 4 days     Darlin Droparole N Noble Bodie, MD [facility] Pager [phone]  If 7PM-7AM, please contact night-coverage [email or web address] Password [email or web address] 12/09/2016, 10:12 AM

## 2016-12-10 LAB — HEMOGLOBIN A1C
Hgb A1c MFr Bld: 5.7 % — ABNORMAL HIGH (ref 4.8–5.6)
MEAN PLASMA GLUCOSE: 116.89 mg/dL

## 2016-12-10 LAB — BASIC METABOLIC PANEL
ANION GAP: 9 (ref 5–15)
BUN: 12 mg/dL (ref 6–20)
CHLORIDE: 100 mmol/L — AB (ref 101–111)
CO2: 27 mmol/L (ref 22–32)
CREATININE: 0.57 mg/dL (ref 0.44–1.00)
Calcium: 9.1 mg/dL (ref 8.9–10.3)
GFR calc non Af Amer: >60 mL/min (ref >60)
Glucose, Bld: 101 mg/dL — ABNORMAL HIGH (ref 65–99)
POTASSIUM: 3.8 mmol/L (ref 3.5–5.1)
SODIUM: 136 mmol/L (ref 135–145)

## 2016-12-10 LAB — GLUCOSE, CAPILLARY
GLUCOSE-CAPILLARY: 68 mg/dL (ref 65–99)
GLUCOSE-CAPILLARY: 67 mg/dL (ref 65–99)
Glucose-Capillary: 106 mg/dL — ABNORMAL HIGH (ref 65–99)
Glucose-Capillary: 90 mg/dL (ref 65–99)

## 2016-12-10 LAB — CBC
HEMATOCRIT: 34.5 % — AB (ref 36.0–46.0)
HEMOGLOBIN: 11 g/dL — AB (ref 12.0–15.0)
MCH: 27.7 pg (ref 26.0–34.0)
MCHC: 31.9 g/dL (ref 30.0–36.0)
MCV: 86.9 fL (ref 78.0–100.0)
PLATELETS: 560 10*3/uL — AB (ref 150–400)
RBC: 3.97 MIL/uL (ref 3.87–5.11)
RDW: 15.3 % (ref 11.5–15.5)
WBC: 14.8 10*3/uL — AB (ref 4.0–10.5)

## 2016-12-10 NOTE — Progress Notes (Signed)
PROGRESS NOTE  Meghan AngerJoanna Koffler ZOX:[id number] DOB: 01/20/1975 DOA: 12/05/2016 PCP: Jeannie DoneMetz, Louise, MD  HPI/Recap of past 24 hours: Meghan Wallace is a 41 y.o. female with medical history significant of PCOS, obesity, hypothyroidism comes in with over 2 days of worsening left Bauserman leg swelling, pain, and redness.  She did have a recent ultrasound which was neg for DVT.  Pt being referred for admission for worsening cellulitis despite attempt at outpatient treatment with bactrim 1 tab BID.  Pt seen and examined at her bedside. She reports her left Mcduffey extremity pain is well controlled on her pain management. MRI tibia 12/08/16 revealed: Left Deharo leg cellulitis. 5 x 8 x 37 mm abscess in the distal left anterolateral Song leg at the level of the ankle joint.   Ortho surgery following and will reassess today 12/10/16 for possible I&D if indicated. Wbc 14 from 15k. Blood cx x2 ordered.  Pt seen and examined with no family member at her bedside. Pain at her left Cauthorn extremity is well controlled 3/10 achy and non radiating.     Assessment/Plan: Principal Problem:   Cellulitis Active Problems:   Severe obesity (BMI >= 40) (HCC)   PCOS (polycystic ovarian syndrome)   Hyperlipidemia   Thyroid disease  Acute LLE Cellulitis- PCN allergic -meropenem and IV vanc. 5 days of IV antibiotics -MRI left LE:  Left Resetar leg cellulitis. 5 x 8 x 37 mm abscess in the distal left anterolateral Dipiero leg at the level of the ankle joint. -ortho following -possible I&D today 12/10/16 -A1C 5.7 (12/10/16)  Leukocytosis 2/2 to cellulitis -persistent but wbc trending down from 15.8 to 14.8k -afebrile -continue IV antibiotics -blood cx x2 in process   Severe obesity (BMI >= 40) -weight loss thru healthy diet and reg exercise -follows with nutritionist in outpatient setting, continue    PCOS (polycystic ovarian syndrome)-  -restart metformin, no aki, ckd or elevated LFTs    Hyperlipidemia-  -cont home  med    Thyroid disease - cont home med   Code Status: Full  Family Communication: No family member at bedside.  Disposition Plan:  possible I&D Today 12/10/16. Will stay another midnight to continue Iv antibiotics.   Consultants:  Orthopedic surgery  Procedures:  Nobne  Antimicrobials:  IV meropenem and IV vanc day # 4 of antibiotics  DVT prophylaxis:  lovenox sq 40 mg daily   Objective: Vitals:   12/09/16 0524 12/09/16 1400 12/09/16 2130 12/10/16 0535  BP: (!) 119/55 (!) 103/50 (!) 106/58 (!) 118/49  Pulse: 64 63 76 60  Resp: 16 18 16 17   Temp: 98 F (36.7 C) 98.1 F (36.7 C) 98.4 F (36.9 C) 98.2 F (36.8 C)  TempSrc: Oral Oral Oral Oral  SpO2: 98% 100% 99% 97%  Weight:      Height:        Intake/Output Summary (Last 24 hours) at 12/10/2016 1055 Last data filed at 12/10/2016 [id number] Gross per 24 hour  Intake 1280 ml  Output -  Net 1280 ml   Filed Weights   12/05/16 0932  Weight: 124.7 kg (275 lb)    Exam:   General:  41 yo CF obese, NAD  Cardiovascular: RRR no rubs or gallops  Respiratory: CTA with no wheezes or rales  Abdomen: obese soft NT ND NBS x4  Musculoskeletal: left Kendrick extremity with mid tibia erythema and edema  Skin: as stated above pulses 2/4 in all 4  Psychiatry: Mood is appropriate for condition and setting  Data Reviewed: CBC: Recent Labs  Lab 12/05/16 1021 12/06/16 0544 12/07/16 0543 12/08/16 0533 12/09/16 0607 12/10/16 0513  WBC 13.3* 11.4* 10.4 12.9* 15.8* 14.8*  NEUTROABS 10.1*  --  6.3 9.2* 9.1*  --   HGB 11.3* 10.5* 10.5* 11.7* 11.8* 11.0*  HCT 33.4* 32.5* 32.2* 35.4* 36.5 34.5*  MCV 83.3 84.6 84.3 85.7 87.5 86.9  PLT 352 364 396 348 573* 560*   Basic Metabolic Panel: Recent Labs  Lab 12/06/16 0544 12/07/16 0543 12/08/16 0533 12/09/16 0607 12/10/16 0513  NA 135 139 137 138 136  K 3.6 3.9 4.1 4.0 3.8  CL 103 104 105 102 100*  CO2 23 26 24 25 27   GLUCOSE 94 107* 155* 80 101*  BUN 7 8 9 10 12     CREATININE 0.64 0.69 0.54 0.57 0.57  CALCIUM 8.6* 9.1 9.1 9.1 9.1  MG  --  2.1  --   --   --    GFR: Estimated Creatinine Clearance: 126.2 mL/min (by C-G formula based on SCr of 0.57 mg/dL). Liver Function Tests: No results for input(s): AST, ALT, ALKPHOS, BILITOT, PROT, ALBUMIN in the last 168 hours. No results for input(s): LIPASE, AMYLASE in the last 168 hours. No results for input(s): AMMONIA in the last 168 hours. Coagulation Profile: No results for input(s): INR, PROTIME in the last 168 hours. Cardiac Enzymes: No results for input(s): CKTOTAL, CKMB, CKMBINDEX, TROPONINI in the last 168 hours. BNP (last 3 results) No results for input(s): PROBNP in the last 8760 hours. HbA1C: No results for input(s): HGBA1C in the last 72 hours. CBG: Recent Labs  Lab 12/09/16 0726 12/09/16 1225 12/09/16 1709 12/09/16 2134 12/10/16 0719  GLUCAP 71 86 75 81 106*   Lipid Profile: No results for input(s): CHOL, HDL, LDLCALC, TRIG, CHOLHDL, LDLDIRECT in the last 72 hours. Thyroid Function Tests: No results for input(s): TSH, T4TOTAL, FREET4, T3FREE, THYROIDAB in the last 72 hours. Anemia Panel: No results for input(s): VITAMINB12, FOLATE, FERRITIN, TIBC, IRON, RETICCTPCT in the last 72 hours. Urine analysis:    Component Value Date/Time   COLORURINE YELLOW 02/27/2009 2025   APPEARANCEUR CLEAR 02/27/2009 2025   LABSPEC <1.005 (L) 02/27/2009 2025   PHURINE 6.0 02/27/2009 2025   GLUCOSEU NEGATIVE 02/27/2009 2025   HGBUR SMALL (A) 02/27/2009 2025   BILIRUBINUR NEGATIVE 02/27/2009 2025   KETONESUR NEGATIVE 02/27/2009 2025   PROTEINUR NEGATIVE 02/27/2009 2025   UROBILINOGEN 0.2 02/27/2009 2025   NITRITE NEGATIVE 02/27/2009 2025   LEUKOCYTESUR NEGATIVE 02/27/2009 2025   Sepsis Labs: @LABRCNTIP (procalcitonin:4,lacticidven:4)  ) Recent Results (from the past 240 hour(s))  MRSA PCR Screening     Status: None   Collection Time: 12/07/16  1:40 PM  Result Value Ref Range Status   MRSA  by PCR NEGATIVE NEGATIVE Final    Comment:        The GeneXpert MRSA Assay (FDA approved for NASAL specimens only), is one component of a comprehensive MRSA colonization surveillance program. It is not intended to diagnose MRSA infection nor to guide or monitor treatment for MRSA infections.       Studies: No results found.  Scheduled Meds: . enoxaparin (LOVENOX) injection  40 mg Subcutaneous Q24H  . escitalopram  20 mg Oral Daily  . insulin aspart  0-9 Units Subcutaneous TID WC  . levothyroxine  75 mcg Oral QAC breakfast  . metFORMIN  1,000 mg Oral BID WC  . sodium chloride flush  3 mL Intravenous Q12H    Continuous Infusions: . sodium chloride  250 mL (12/05/16 1603)  . meropenem (MERREM) IV Stopped (12/10/16 0728)  . vancomycin Stopped (12/10/16 0150)     LOS: 5 days     Darlin Droparole N Starr Urias, MD [facility] Pager [phone]  If 7PM-7AM, please contact night-coverage [email or web address] Password [email or web address] 12/10/2016, 10:55 AM

## 2016-12-10 NOTE — Progress Notes (Signed)
Patient reexamined today. She has had one episode of chills last night. She is able to ambulate but is clinically not really improved from 2 days ago White count remains only slightly decreased to 14,000 I reexamined the leg and tried to correlated with the MRI scan.  Again nothing definitively surgical in the leg on exam.  She has 2 areas of induration directly anterior over the mid tibia and posterior over the muscle tendon junction of the Achilles.  None of these areas has any palpable fluctuance.  The distal anterolateral region over the ankle has no definable palpable areas of fluctuance or erythema.  I think the patient may have a closed space infection in her leg but it is not immediately apparent where that is.  I would favor interventional radiology aspiration via ultrasound guidance of any fluctuant areas.  That could be followed up with surgical debridement once they are localized.  I cannot palpate any areas of fluctuance for debridement and the MRI scan to my reading is inconclusive about a definitive abscess.  Plan for interventional radiology consult tomorrow

## 2016-12-11 ENCOUNTER — Inpatient Hospital Stay (HOSPITAL_COMMUNITY): Payer: BLUE CROSS/BLUE SHIELD

## 2016-12-11 DIAGNOSIS — B999 Unspecified infectious disease: Secondary | ICD-10-CM

## 2016-12-11 LAB — VANCOMYCIN, PEAK: VANCOMYCIN PK: 26 ug/mL — AB (ref 30–40)

## 2016-12-11 LAB — CBC
HEMATOCRIT: 35.4 % — AB (ref 36.0–46.0)
Hemoglobin: 11.3 g/dL — ABNORMAL LOW (ref 12.0–15.0)
MCH: 27.7 pg (ref 26.0–34.0)
MCHC: 31.9 g/dL (ref 30.0–36.0)
MCV: 86.8 fL (ref 78.0–100.0)
Platelets: 604 10*3/uL — ABNORMAL HIGH (ref 150–400)
RBC: 4.08 MIL/uL (ref 3.87–5.11)
RDW: 15.1 % (ref 11.5–15.5)
WBC: 15.9 10*3/uL — AB (ref 4.0–10.5)

## 2016-12-11 LAB — GLUCOSE, CAPILLARY
GLUCOSE-CAPILLARY: 79 mg/dL (ref 65–99)
GLUCOSE-CAPILLARY: 76 mg/dL (ref 65–99)
Glucose-Capillary: 78 mg/dL (ref 65–99)
Glucose-Capillary: 89 mg/dL (ref 65–99)

## 2016-12-11 LAB — VANCOMYCIN, TROUGH: Vancomycin Tr: 9 ug/mL — ABNORMAL LOW (ref 15–20)

## 2016-12-11 MED ORDER — VANCOMYCIN HCL 10 G IV SOLR
1250.0000 mg | Freq: Two times a day (BID) | INTRAVENOUS | Status: DC
  Administered 2016-12-12 – 2016-12-14 (×7): 1250 mg via INTRAVENOUS
  Filled 2016-12-11 (×8): qty 1250

## 2016-12-11 NOTE — Progress Notes (Signed)
PROGRESS NOTE  Meghan AngerJoanna Wallace WUJ:[id number]:[id number] DOB: 09/11/1975 DOA: 12/05/2016 PCP: Jeannie DoneMetz, Louise, MD  HPI/Recap of past 24 hours: Meghan AngerJoanna Aller is a 41 y.o. female with medical history significant of PCOS, obesity, hypothyroidism comes in with over 2 days of worsening left Henry leg swelling, pain, and redness.  She did have a recent ultrasound which was neg for DVT.  Pt being referred for admission for worsening cellulitis despite attempt at outpatient treatment with bactrim 1 tab BID.  Pt seen and examined at her bedside. She reports her left Brissett extremity pain is well controlled on her pain management. MRI tibia 12/08/16 revealed: Left Mohamed leg cellulitis. 5 x 8 x 37 mm abscess in the distal left anterolateral Weider leg at the level of the ankle joint.   Ortho surgery following and will reassess today 12/11/16 for possible I&D if indicated. [id number]. Blood cx x2 no growth less than 24 hours.  Pt seen and examined with no family member at her bedside. Pain at her left Knieriem extremity is well controlled 3/10 achy and non radiating.    Assessment/Plan: Principal Problem:   Cellulitis Active Problems:   Severe obesity (BMI >= 40) (HCC)   PCOS (polycystic ovarian syndrome)   Hyperlipidemia   Thyroid disease  Acute LLE Cellulitis- PCN allergic -meropenem and IV vanc. 5 days of IV antibiotics -MRI left LE:  Left Koral leg cellulitis. 5 x 8 x 37 mm abscess in the distal left anterolateral Palos leg at the level of the ankle joint. -ortho following -possible I&D today 12/11/16 -A1C 5.7 (12/10/16) -IR consulted for drainage/aspiration 12/11/16  Leukocytosis 2/2 to cellulitis -persistent wbc15k from 14k -afebrile -continue IV antibiotics -blood cx x2 no growth less than 24 hours   Severe obesity (BMI >= 40) -weight loss thru healthy diet and reg exercise -follows with nutritionist in outpatient setting, continue    PCOS (polycystic ovarian syndrome)-  -restart metformin, no aki, ckd or  elevated LFTs    Hyperlipidemia-  -cont home med    Thyroid disease - cont home med   Code Status: Full  Family Communication: mother at bedside. All questions answered to her satisfaction.  Disposition Plan:  possible I&D Today 12/10/16. Will stay another midnight to continue Iv antibiotics.   Consultants:  Orthopedic surgery  IR for drainage of suspected abscess 12/11/16  Procedures:  [place]  Antimicrobials:  IV meropenem and IV vanc day # 5 of antibiotics  DVT prophylaxis:  lovenox sq 40 mg daily   Objective: Vitals:   12/10/16 0535 12/10/16 1336 12/10/16 2134 12/11/16 0630  BP: (!) 118/49 (!) 110/49 (!) 108/56 (!) 101/46  Pulse: 60 72 74 63  Resp: 17 17 17 16   Temp: 98.2 F (36.8 C) 98.1 F (36.7 C) 98.1 F (36.7 C) 98 F (36.7 C)  TempSrc: Oral Oral Oral Oral  SpO2: 97% 100% 98% 99%  Weight:      Height:        Intake/Output Summary (Last 24 hours) at 12/11/2016 1832 Last data filed at 12/11/2016 1824 Gross per 24 hour  Intake 1060 ml  Output -  Net 1060 ml   Filed Weights   12/05/16 0932  Weight: 124.7 kg (275 lb)    Exam:   General:  41 yo CF obese, NAD  Cardiovascular: RRR no rubs or gallops  Respiratory: CTA with no wheezes or rales  Abdomen: obese soft NT ND NBS x4  Musculoskeletal: left Hollenback extremity with mid tibia erythema and edema. Anterior and posterior of  left Fillingim leg.  Skin: as stated above pulses 2/4 in all 4  Psychiatry: Mood is appropriate for condition and setting   Data Reviewed: CBC: Recent Labs  Lab 12/05/16 1021  12/07/16 0543 12/08/16 0533 12/09/16 0607 12/10/16 0513 12/11/16 0500  WBC 13.3*   < > 10.4 12.9* 15.8* 14.8* 15.9*  NEUTROABS 10.1*  --  6.3 9.2* 9.1*  --   --   HGB 11.3*   < > 10.5* 11.7* 11.8* 11.0* 11.3*  HCT 33.4*   < > 32.2* 35.4* 36.5 34.5* 35.4*  MCV 83.3   < > 84.3 85.7 87.5 86.9 86.8  PLT 352   < > 396 348 573* 560* 604*   < > = values in this interval not displayed.   Basic  Metabolic Panel: Recent Labs  Lab 12/06/16 0544 12/07/16 0543 12/08/16 0533 12/09/16 0607 12/10/16 0513  NA 135 139 137 138 136  K 3.6 3.9 4.1 4.0 3.8  CL 103 104 105 102 100*  CO2 23 26 24 25 27   GLUCOSE 94 107* 155* 80 101*  BUN 7 8 9 10 12   CREATININE 0.64 0.69 0.54 0.57 0.57  CALCIUM 8.6* 9.1 9.1 9.1 9.1  MG  --  2.1  --   --   --    GFR: Estimated Creatinine Clearance: 126.2 mL/min (by C-G formula based on SCr of 0.57 mg/dL). Liver Function Tests: No results for input(s): AST, ALT, ALKPHOS, BILITOT, PROT, ALBUMIN in the last 168 hours. No results for input(s): LIPASE, AMYLASE in the last 168 hours. No results for input(s): AMMONIA in the last 168 hours. Coagulation Profile: No results for input(s): INR, PROTIME in the last 168 hours. Cardiac Enzymes: No results for input(s): CKTOTAL, CKMB, CKMBINDEX, TROPONINI in the last 168 hours. BNP (last 3 results) No results for input(s): PROBNP in the last 8760 hours. HbA1C: Recent Labs    12/10/16 1112  HGBA1C 5.7*   CBG: Recent Labs  Lab 12/10/16 1723 12/10/16 2141 12/11/16 0740 12/11/16 1211 12/11/16 1731  GLUCAP 67 90 79 76 78   Lipid Profile: No results for input(s): CHOL, HDL, LDLCALC, TRIG, CHOLHDL, LDLDIRECT in the last 72 hours. Thyroid Function Tests: No results for input(s): TSH, T4TOTAL, FREET4, T3FREE, THYROIDAB in the last 72 hours. Anemia Panel: No results for input(s): VITAMINB12, FOLATE, FERRITIN, TIBC, IRON, RETICCTPCT in the last 72 hours. Urine analysis:    Component Value Date/Time   COLORURINE YELLOW 02/27/2009 2025   APPEARANCEUR CLEAR 02/27/2009 2025   LABSPEC <1.005 (L) 02/27/2009 2025   PHURINE 6.0 02/27/2009 2025   GLUCOSEU NEGATIVE 02/27/2009 2025   HGBUR SMALL (A) 02/27/2009 2025   BILIRUBINUR NEGATIVE 02/27/2009 2025   KETONESUR NEGATIVE 02/27/2009 2025   PROTEINUR NEGATIVE 02/27/2009 2025   UROBILINOGEN 0.2 02/27/2009 2025   NITRITE NEGATIVE 02/27/2009 2025   LEUKOCYTESUR  NEGATIVE 02/27/2009 2025   Sepsis Labs: @LABRCNTIP (procalcitonin:4,lacticidven:4)  ) Recent Results (from the past 240 hour(s))  MRSA PCR Screening     Status: None   Collection Time: 12/07/16  1:40 PM  Result Value Ref Range Status   MRSA by PCR NEGATIVE NEGATIVE Final    Comment:        The GeneXpert MRSA Assay (FDA approved for NASAL specimens only), is one component of a comprehensive MRSA colonization surveillance program. It is not intended to diagnose MRSA infection nor to guide or monitor treatment for MRSA infections.   Culture, blood (routine x 2)     Status: None (Preliminary result)  Collection Time: 12/10/16 11:11 AM  Result Value Ref Range Status   Specimen Description BLOOD RIGHT ANTECUBITAL  Final   Special Requests   Final    BOTTLES DRAWN AEROBIC AND ANAEROBIC Blood Culture adequate volume   Culture   Final    NO GROWTH < 24 HOURS Performed at [facility], [address] [place], [place] [address]    Report Status PENDING  Incomplete  Culture, blood (routine x 2)     Status: None (Preliminary result)   Collection Time: 12/10/16 11:23 AM  Result Value Ref Range Status   Specimen Description BLOOD RIGHT ANTECUBITAL  Final   Special Requests   Final    BOTTLES DRAWN AEROBIC AND ANAEROBIC Blood Culture adequate volume   Culture   Final    NO GROWTH < 24 HOURS Performed at [facility], [address], [place], [place] [address]    Report Status PENDING  Incomplete      Studies: [place] Limited Joint Space Structures Low Left  Result Date: 12/11/2016 CLINICAL DATA:  41 year old with left Tafoya extremity cellulitis. Concern for abscess along the anterolateral ankle region. EXAM: ULTRASOUND LEFT Aybar EXTREMITY LIMITED TECHNIQUE: Ultrasound examination of the Allocca extremity soft tissues was performed in the area of clinical concern. COMPARISON:  MRI 12/08/2016 FINDINGS: The ankle was evaluated with ultrasound. There is extensive edema along the  extensor digitorum longus. However, there is not a discrete fluid collection. Subcutaneous edema throughout the Gavia calf and ankle region. IMPRESSION: Extensive edema along the extensor digitorum longus. Findings are suggestive for tenosynovitis. No discrete abscess. MRI of the ankle may be useful to further evaluate the extent of this inflammation. Electronically Signed   By: Richarda Overlie M.D.   On: 12/11/2016 16:57    Scheduled Meds: . enoxaparin (LOVENOX) injection  40 mg Subcutaneous Q24H  . escitalopram  20 mg Oral Daily  . insulin aspart  0-9 Units Subcutaneous TID WC  . levothyroxine  75 mcg Oral QAC breakfast  . metFORMIN  1,000 mg Oral BID WC  . sodium chloride flush  3 mL Intravenous Q12H    Continuous Infusions: . sodium chloride 250 mL (12/05/16 1603)  . meropenem (MERREM) IV Stopped (12/11/16 1502)  . vancomycin       LOS: 6 days     Darlin Drop, MD [facility] Pager [phone]  If 7PM-7AM, please contact night-coverage [email or web address] Password [email or web address] 12/11/2016, 6:32 PM

## 2016-12-11 NOTE — Progress Notes (Signed)
Pharmacy Antibiotic Note  Meghan Wallace is a 41 y.o. female on bactrim PTA for left LE cellulitis, presented to the ED on 12/05/2016 with c/o fever and leg pain.  Patient was started on clindamycin on admission.  Abx changed to merrem and vancomycin d/t worsening of cellulitis.  - 12/4 left tibia/fibula FAO:ZHYQRI:Left Kochan leg cellulitis. 5 x 8 x 37 mm abscess in the distal left anterolateral Show leg at the level of the ankle joint.  Today, 12/11/2016: - afeb, wbc 14.8 - scr stable (crcl>100) - plan to aspirate LLE collection today   12/7 at 0955 tr = 9 (dose given at 11:02am)         at 1305 pk = 26          Ke= 0.1201, half life=5.8, Vd= 42, AUC 398         Change to 1250 mg q12h for est AUC 496 (cmax 36, Cmin 10)  Plan: - adjust vancomycin 1250 mg IV q12h for est AUC 496 - continue merrem 1gm IV q8h.  ___________________________________  Height: 5\' 6"  (167.6 cm) Weight: 275 lb (124.7 kg) IBW/kg (Calculated) : 59.3  Temp (24hrs), Avg:98.1 F (36.7 C), Min:98 F (36.7 C), Max:98.1 F (36.7 C)  Recent Labs  Lab 12/05/16 1024 12/06/16 0544 12/07/16 0543 12/08/16 0533 12/09/16 0607 12/10/16 0513 12/11/16 0500 12/11/16 0955  WBC  --  11.4* 10.4 12.9* 15.8* 14.8* 15.9*  --   CREATININE  --  0.64 0.69 0.54 0.57 0.57  --   --   LATICACIDVEN 0.69  --  1.0 1.6  --   --   --   --   VANCOTROUGH  --   --   --   --   --   --   --  9*    Estimated Creatinine Clearance: 126.2 mL/min (by C-G formula based on SCr of 0.57 mg/dL).    Allergies  Allergen Reactions  . Other     Other reaction(s): Other (See Comments) Fresh fruits ( apples, pears, and plums ) makes throat itch  . Penicillin G Hives    Has patient had a PCN reaction causing immediate rash, facial/tongue/throat swelling, SOB or lightheadedness with hypotension: {yes Has patient had a PCN reaction causing severe rash involving mucus membranes or skin necrosis: no Has patient had a PCN reaction that required hospitalization:  no Has patient had a PCN reaction occurring within the last 10 years: yes If all of the above answers are "NO", then may proceed with Cephalosporin use.    Antimicrobials this admission:  12/1 clinda>> 12/3 12/3 merrem>> 12/3 vanc>>  Dose adjustments this admission:  12/7 at 0955 tr = 9 (dose given at 11:02am)         at 1305 pk = 26          Ke= 0.1201, half life=5.8, Vd= 42         Change to 1250 mg q12h for est AUC 496 (cmax 36, Cmin 10)  Microbiology results:  12/3 MRSA PCR: neg 12/6 bcx x2:    Thank you for allowing pharmacy to be a part of this patient's care.  Lucia Gaskinsham, Mindel Friscia P 12/11/2016 1:41 PM

## 2016-12-11 NOTE — Progress Notes (Signed)
Patient seen and consented for US guided aspiration of a LLE fluid collection if present under US.  Meghan Wallace 11:41 AM 12/11/2016

## 2016-12-12 LAB — CBC
HEMATOCRIT: 36.7 % (ref 36.0–46.0)
Hemoglobin: 11.8 g/dL — ABNORMAL LOW (ref 12.0–15.0)
MCH: 28.1 pg (ref 26.0–34.0)
MCHC: 32.2 g/dL (ref 30.0–36.0)
MCV: 87.4 fL (ref 78.0–100.0)
PLATELETS: 593 10*3/uL — AB (ref 150–400)
RBC: 4.2 MIL/uL (ref 3.87–5.11)
RDW: 15.1 % (ref 11.5–15.5)
WBC: 15.2 10*3/uL — AB (ref 4.0–10.5)

## 2016-12-12 LAB — BASIC METABOLIC PANEL
ANION GAP: 9 (ref 5–15)
BUN: 11 mg/dL (ref 6–20)
CALCIUM: 9.3 mg/dL (ref 8.9–10.3)
CO2: 28 mmol/L (ref 22–32)
Chloride: 99 mmol/L — ABNORMAL LOW (ref 101–111)
Creatinine, Ser: 0.58 mg/dL (ref 0.44–1.00)
GFR calc Af Amer: >60 mL/min (ref >60)
GLUCOSE: 112 mg/dL — AB (ref 65–99)
Potassium: 4 mmol/L (ref 3.5–5.1)
Sodium: 136 mmol/L (ref 135–145)

## 2016-12-12 LAB — GLUCOSE, CAPILLARY
GLUCOSE-CAPILLARY: 76 mg/dL (ref 65–99)
Glucose-Capillary: 79 mg/dL (ref 65–99)
Glucose-Capillary: 71 mg/dL (ref 65–99)
Glucose-Capillary: 92 mg/dL (ref 65–99)

## 2016-12-12 NOTE — Progress Notes (Signed)
Patient ID: Meghan Wallace Wince, female   DOB: 02/20/1975, [age] y.o.   MRN: [id number]  PROGRESS NOTE    Meghan Wallace Yurick  WUJ:[id number] DOB: 10/18/1975 DOA: 12/05/2016  PCP: Jeannie DoneMetz, Louise, MD   Brief Narrative:  41 y.o.femalewith medical history significant ofPCOS, obesity, hypothyroidism comes in with over 2 days of worsening left Ledet leg swelling, pain, and redness. She did have a recent ultrasound which was neg for DVT. Pt being referred for admission for worsening cellulitis despite attempt at outpatient treatment with bactrim 1 tab BID.  Pt seen and examined at her bedside. She reports her left Vanderstelt extremity pain is well controlled on her pain management. MRI tibia 12/08/16 revealed: Left Nevin leg cellulitis. 5 x 8 x 37 mm abscess in the distal left anterolateral Depuy leg at the level of the ankle joint.  Ortho surgery following, possible I&D if indicated. [id number]. Blood cx x2 no growth less than 24 hours.  Assessment & Plan:   Acute left Beauregard extremity cellulitis / Leukocytosis  -MRI left LE:  Left Teall leg cellulitis. 5 x 8 x 37 mm abscess in the distal left anterolateral Segler leg at the level of the ankle joint. -IR consulted for drainage/aspiration 12/11/16 - US 12/7 showed extensive edema along the extensor digitorum longus. Findings are suggestive for tenosynovitis. No discrete abscess. MRI of the ankle may be useful to further evaluate the extent of this inflammation - Blood cx so far negative  - Ortho is following - Continue meropenem and vanco  Severe obesity (BMI >= 40) - Counseled on diet, nutrition   PCOS (polycystic ovarian syndrome)-  - Continue metformin   Thyroid disease - Continue synthroid    DVT prophylaxis: Lovenox subQ Code Status: full code  Family Communication: no family at the bedside  Disposition Plan: home once cleared by ortho    Consultants:   Ortho   Procedures:   None   Antimicrobials:   Meropenem and vanco 12/3  -->   Subjective: No overnight events.   Objective: Vitals:   12/10/16 2134 12/11/16 0630 12/11/16 2150 12/12/16 0510  BP: (!) 108/56 (!) 101/46 (!) 109/41 (!) 110/53  Pulse: 74 63 71 76  Resp: 17 16 16 16   Temp: 98.1 F (36.7 C) 98 F (36.7 C) 98.3 F (36.8 C) 97.7 F (36.5 C)  TempSrc: Oral Oral Oral Oral  SpO2: 98% 99% 96% 97%  Weight:      Height:        Intake/Output Summary (Last 24 hours) at 12/12/2016 1035 Last data filed at 12/12/2016 0640 Gross per 24 hour  Intake 1150 ml  Output 1 ml  Net 1149 ml   Filed Weights   12/05/16 0932  Weight: 124.7 kg (275 lb)    Examination:  General exam: Appears calm and comfortable  Respiratory system: Clear to auscultation. Respiratory effort normal. Cardiovascular system: S1 & S2 heard, RRR. No JVD, murmurs, rubs, gallops or clicks. No pedal edema. Gastrointestinal system: Abdomen is nondistended, soft and nontender. No organomegaly or masses felt. Normal bowel sounds heard. Central nervous system: Alert and oriented. No focal neurological deficits. Extremities: LLE with redness and warmth, swelling in LLE; No swelling in RLE Skin: No rashes, lesions or ulcers Psychiatry: Judgement and insight appear normal. Mood & affect appropriate.   Data Reviewed: I have personally reviewed following labs and imaging studies  CBC: Recent Labs  Lab 12/07/16 0543 12/08/16 0533 12/09/16 0607 12/10/16 0513 12/11/16 0500 12/12/16 0818  WBC 10.4 12.9* 15.8* 14.8* 15.9* 15.2*  NEUTROABS 6.3 9.2* 9.1*  --   --   --   HGB 10.5* 11.7* 11.8* 11.0* 11.3* 11.8*  HCT 32.2* 35.4* 36.5 34.5* 35.4* 36.7  MCV 84.3 85.7 87.5 86.9 86.8 87.4  PLT 396 348 573* 560* 604* 593*   Basic Metabolic Panel: Recent Labs  Lab 12/07/16 0543 12/08/16 0533 12/09/16 0607 12/10/16 0513 12/12/16 0818  NA 139 137 138 136 136  K 3.9 4.1 4.0 3.8 4.0  CL 104 105 102 100* 99*  CO2 26 24 25 27 28   GLUCOSE 107* 155* 80 101* 112*  BUN 8 9 10 12 11    CREATININE 0.69 0.54 0.57 0.57 0.58  CALCIUM 9.1 9.1 9.1 9.1 9.3  MG 2.1  --   --   --   --    GFR: Estimated Creatinine Clearance: 126.2 mL/min (by C-G formula based on SCr of 0.58 mg/dL). Liver Function Tests: No results for input(s): AST, ALT, ALKPHOS, BILITOT, PROT, ALBUMIN in the last 168 hours. No results for input(s): LIPASE, AMYLASE in the last 168 hours. No results for input(s): AMMONIA in the last 168 hours. Coagulation Profile: No results for input(s): INR, PROTIME in the last 168 hours. Cardiac Enzymes: No results for input(s): CKTOTAL, CKMB, CKMBINDEX, TROPONINI in the last 168 hours. BNP (last 3 results) No results for input(s): PROBNP in the last 8760 hours. HbA1C: Recent Labs    12/10/16 1112  HGBA1C 5.7*   CBG: Recent Labs  Lab 12/11/16 0740 12/11/16 1211 12/11/16 1731 12/11/16 2200 12/12/16 0753  GLUCAP 79 76 78 89 79   Lipid Profile: No results for input(s): CHOL, HDL, LDLCALC, TRIG, CHOLHDL, LDLDIRECT in the last 72 hours. Thyroid Function Tests: No results for input(s): TSH, T4TOTAL, FREET4, T3FREE, THYROIDAB in the last 72 hours. Anemia Panel: No results for input(s): VITAMINB12, FOLATE, FERRITIN, TIBC, IRON, RETICCTPCT in the last 72 hours. Urine analysis:    Component Value Date/Time   COLORURINE YELLOW 02/27/2009 2025   APPEARANCEUR CLEAR 02/27/2009 2025   LABSPEC <1.005 (L) 02/27/2009 2025   PHURINE 6.0 02/27/2009 2025   GLUCOSEU NEGATIVE 02/27/2009 2025   HGBUR SMALL (A) 02/27/2009 2025   BILIRUBINUR NEGATIVE 02/27/2009 2025   KETONESUR NEGATIVE 02/27/2009 2025   PROTEINUR NEGATIVE 02/27/2009 2025   UROBILINOGEN 0.2 02/27/2009 2025   NITRITE NEGATIVE 02/27/2009 2025   LEUKOCYTESUR NEGATIVE 02/27/2009 2025   Sepsis Labs: @LABRCNTIP (procalcitonin:4,lacticidven:4)   Recent Results (from the past 240 hour(s))  MRSA PCR Screening     Status: None   Collection Time: 12/07/16  1:40 PM  Result Value Ref Range Status   MRSA by PCR  NEGATIVE NEGATIVE Final    Comment:        The GeneXpert MRSA Assay (FDA approved for NASAL specimens only), is one component of a comprehensive MRSA colonization surveillance program. It is not intended to diagnose MRSA infection nor to guide or monitor treatment for MRSA infections.   Culture, blood (routine x 2)     Status: None (Preliminary result)   Collection Time: 12/10/16 11:11 AM  Result Value Ref Range Status   Specimen Description BLOOD RIGHT ANTECUBITAL  Final   Special Requests   Final    BOTTLES DRAWN AEROBIC AND ANAEROBIC Blood Culture adequate volume   Culture   Final    NO GROWTH < 24 HOURS Performed at [facility], [address], [place], [place] [address]    Report Status PENDING  Incomplete  Culture, blood (routine x 2)  Status: None (Preliminary result)   Collection Time: 12/10/16 11:23 AM  Result Value Ref Range Status   Specimen Description BLOOD RIGHT ANTECUBITAL  Final   Special Requests   Final    BOTTLES DRAWN AEROBIC AND ANAEROBIC Blood Culture adequate volume   Culture   Final    NO GROWTH < 24 HOURS Performed at [facility], [address] [place], [place] [address]    Report Status PENDING  Incomplete      Radiology Studies: Mr Tibia Fibula Left W Wo Contrast  Addendum Date: 12/11/2016   ADDENDUM REPORT: 12/11/2016 16:54 ADDENDUM: Ultrasound performed today demonstrates no focal fluid collection in the soft tissues. There is thickening of the extensor digitorum tendon with echogenic material within the tendon sheath at the site of the peripherally enhancing fluid collection in the lateral left Brun leg/ankle. Given these findings the appearance is concerning for infectious tenosynovitis of the extensor digitorum longus tendon sheath. MR ankle without and with intravenous contrast is recommended to evaluate the extent of the abnormality and to better characterize the abnormality given that is dated at the inferior-most aspect  of the current examination. Electronically Signed   By: Elige KoHetal  Patel   On: 12/11/2016 16:54   Result Date: 12/11/2016 CLINICAL DATA:  Soft tissue swelling of the left Guzzetta leg. EXAM: MRI OF Unterreiner LEFT EXTREMITY WITHOUT AND WITH CONTRAST TECHNIQUE: Multiplanar, multisequence MR imaging of the left Sunderland leg was performed both before and after administration of intravenous contrast. CONTRAST:  20mL MULTIHANCE GADOBENATE DIMEGLUMINE 529 MG/ML IV SOLN COMPARISON:  None. FINDINGS: Bones/Joint/Cartilage No marrow signal abnormality. No fracture or dislocation. Normal alignment. No joint effusion. No periosteal reaction or bone destruction. Ligaments Collateral ligaments are intact. Muscles and Tendons Muscles are normal. No muscle edema or muscle atrophy. No intramuscular fluid collection or hematoma. Soft tissue Soft tissue edema in the subcutaneous fat circumferentially around the left Mokry leg with mild heterogeneous enhancement most concerning for cellulitis. 5 x 8 x 37 mm peripheral enhancing fluid collection in the distal left anterolateral Toren leg at the level of the ankle joint concerning for a small abscess. IMPRESSION: 1. Left Kemnitz leg cellulitis. 5 x 8 x 37 mm abscess in the distal left anterolateral Molinari leg at the level of the ankle joint. Electronically Signed: By: Elige KoHetal  Patel On: 12/08/2016 15:24   [place] Limited Joint Space Structures Low Left  Result Date: 12/11/2016 CLINICAL DATA:  41 year old with left Santillano extremity cellulitis. Concern for abscess along the anterolateral ankle region. EXAM: ULTRASOUND LEFT Roh EXTREMITY LIMITED TECHNIQUE: Ultrasound examination of the Teters extremity soft tissues was performed in the area of clinical concern. COMPARISON:  MRI 12/08/2016 FINDINGS: The ankle was evaluated with ultrasound. There is extensive edema along the extensor digitorum longus. However, there is not a discrete fluid collection. Subcutaneous edema throughout the Loconte calf and ankle  region. IMPRESSION: Extensive edema along the extensor digitorum longus. Findings are suggestive for tenosynovitis. No discrete abscess. MRI of the ankle may be useful to further evaluate the extent of this inflammation. Electronically Signed   By: Richarda OverlieAdam  Henn M.D.   On: 12/11/2016 16:57        Scheduled Meds: . enoxaparin (LOVENOX) injection  40 mg Subcutaneous Q24H  . escitalopram  20 mg Oral Daily  . insulin aspart  0-9 Units Subcutaneous TID WC  . levothyroxine  75 mcg Oral QAC breakfast  . metFORMIN  1,000 mg Oral BID WC  . sodium chloride flush  3  mL Intravenous Q12H   Continuous Infusions: . sodium chloride 250 mL (12/05/16 1603)  . meropenem (MERREM) IV Stopped (12/12/16 0700)  . vancomycin Stopped (12/12/16 0432)     LOS: 7 days    Time spent: 25 minutes  Greater than 50% of the time spent on counseling and coordinating the care.   Manson Passey, MD [facility] Pager [phone]  If 7PM-7AM, please contact night-coverage [email or web address] Password [email or web address] 12/12/2016, 10:35 AM

## 2016-12-13 LAB — CBC
HCT: 36.8 % (ref 36.0–46.0)
HEMOGLOBIN: 11.7 g/dL — AB (ref 12.0–15.0)
MCH: 27.9 pg (ref 26.0–34.0)
MCHC: 31.8 g/dL (ref 30.0–36.0)
MCV: 87.8 fL (ref 78.0–100.0)
Platelets: 576 10*3/uL — ABNORMAL HIGH (ref 150–400)
RBC: 4.19 MIL/uL (ref 3.87–5.11)
RDW: 14.9 % (ref 11.5–15.5)
WBC: 13.6 10*3/uL — AB (ref 4.0–10.5)

## 2016-12-13 LAB — GLUCOSE, CAPILLARY
GLUCOSE-CAPILLARY: 120 mg/dL — AB (ref 65–99)
GLUCOSE-CAPILLARY: 78 mg/dL (ref 65–99)
Glucose-Capillary: 69 mg/dL (ref 65–99)

## 2016-12-13 LAB — BASIC METABOLIC PANEL
ANION GAP: 10 (ref 5–15)
BUN: 10 mg/dL (ref 6–20)
CHLORIDE: 101 mmol/L (ref 101–111)
CO2: 25 mmol/L (ref 22–32)
Calcium: 9.2 mg/dL (ref 8.9–10.3)
Creatinine, Ser: 0.52 mg/dL (ref 0.44–1.00)
Glucose, Bld: 89 mg/dL (ref 65–99)
POTASSIUM: 4 mmol/L (ref 3.5–5.1)
SODIUM: 136 mmol/L (ref 135–145)

## 2016-12-13 MED ORDER — METFORMIN HCL ER 500 MG PO TB24
1000.0000 mg | ORAL_TABLET | Freq: Two times a day (BID) | ORAL | Status: DC
  Administered 2016-12-13 – 2016-12-15 (×4): 1000 mg via ORAL
  Filled 2016-12-13 (×5): qty 2

## 2016-12-13 NOTE — Progress Notes (Signed)
Patient ID: Meghan AngerJoanna Bo, female   DOB: 06/14/1975, 41 y.o.   MRN: [id number] Awake alert and Oriented x 4. U//S shows no abscess to drain 12/7 IV antibiotics continue.

## 2016-12-13 NOTE — Progress Notes (Signed)
Patient ID: Meghan Wallace, female   DOB: 09/07/1975, 41 y.o.   MRN: [id number]  PROGRESS NOTE    Meghan Wallace  WUJ:[id number] DOB: 04/20/1975 DOA: 12/05/2016  PCP: Jeannie DoneMetz, Louise, MD   Brief Narrative:  41 y.o.femalewith medical history significant ofPCOS, obesity, hypothyroidism comes in with over 2 days of worsening left Certain leg swelling, pain, and redness. She did have a recent ultrasound which was neg for DVT. Pt being referred for admission for worsening cellulitis despite attempt at outpatient treatment with bactrim 1 tab BID.  Pt seen and examined at her bedside. She reports her left We[place] extremity pain is well controlled on her pain management. MRI tibia 12/08/16 revealed: Left [place] leg cellulitis. 5 x 8 x 37 mm abscess in the distal left anterolateral Cis[place] leg at the level of the ankle joint.  Ortho surgery following, possible I&D if indicated. [id number]. Blood cx x2 no growth less than 24 hours.  Assessment & Plan:   Acute toxic appearing left [place] extremity cellulitis / Leukocytosis  -MRI left LE:  Left Stilley leg cellulitis. 5 x 8 x 37 mm abscess in the distal left anterolateral Takach leg at the level of the ankle joint. - IR was consulted for drainage or aspiration on 12/11/2016 - US 12/7 showed extensive edema along the extensor digitorum longus. Findings are suggestive for tenosynovitis. No discrete abscess. MRI of the ankle may be useful to further evaluate the extent of this inflammation - Continue meropenem and vancomycin - Blood culture so far negative - Cellulitis is improving very slowly - Leukocytosis improving  Severe obesity (BMI >= 40) - Patient was counseled on diet and nutrition  PCOS (polycystic ovarian syndrome)-  - Continue metformin  Thyroid disease - Continue Synthroid    DVT prophylaxis: Lovenox subcutaneous  Code Status: full code  Family Communication:  no family at bedside  Disposition Plan: Discharge once his cellulitis improves     Consultants:   Ortho   Procedures:   None   Antimicrobials:   Meropenem and vanco 12/3 -->   Subjective: No overnight events.   Objective: Vitals:   12/12/16 0510 12/12/16 1341 12/12/16 2050 12/13/16 0524  BP: (!) 110/53 (!) 90/46 (!) 125/58 112/60  Pulse: 76 69 67 70  Resp: 16 16 15 12   Temp: 97.7 F (36.5 C) 97.7 F (36.5 C) 98 F (36.7 C) (!) 97.5 F (36.4 C)  TempSrc: Oral  Oral Oral  SpO2: 97% 99% 99% 99%  Weight:      Height:        Intake/Output Summary (Last 24 hours) at 12/13/2016 0857 Last data filed at 12/13/2016 0524 Gross per 24 hour  Intake 960 ml  Output -  Net 960 ml   Filed Weights   12/05/16 0932  Weight: 124.7 kg (275 lb)    Physical Exam  Constitutional: Appears well-developed and well-nourished. No distress.   CVS: RRR, S1/S2 + Pulmonary: Effort and breath sounds normal, no stridor, rhonchi, wheezes, rales.  Abdominal: Soft. BS +,  no distension, tenderness, rebound or guarding.  Musculoskeletal: Normal range of motion. No tenderness.  Lymphadenopathy: No lymphadenopathy noted, cervical, inguinal. Neuro: Alert. Normal reflexes, muscle tone coordination. No cranial nerve deficit. Skin: LLE redness and swelling just about the left of ankle Psychiatric: Normal mood and affect. Behavior, judgment, thought content normal.      Data Reviewed: I have personally reviewed following labs and imaging studies  CBC: Recent Labs  Lab 12/07/16 0543 12/08/16 0533 12/09/16 [id number] 12/10/16 0513 12/11/16  0500 12/12/16 0818 12/13/16 0459  WBC 10.4 12.9* 15.8* 14.8* 15.9* 15.2* 13.6*  NEUTROABS 6.3 9.2* 9.1*  --   --   --   --   HGB 10.5* 11.7* 11.8* 11.0* 11.3* 11.8* 11.7*  HCT 32.2* 35.4* 36.5 34.5* 35.4* 36.7 36.8  MCV 84.3 85.7 87.5 86.9 86.8 87.4 87.8  PLT 396 348 573* 560* 604* 593* 576*   Basic Metabolic Panel: Recent Labs  Lab 12/07/16 0543 12/08/16 0533 12/09/16 0607 12/10/16 0513 12/12/16 0818 12/13/16 0459  NA 139 137  138 136 136 136  K 3.9 4.1 4.0 3.8 4.0 4.0  CL 104 105 102 100* 99* 101  CO2 26 24 25 27 28 25   GLUCOSE 107* 155* 80 101* 112* 89  BUN 8 9 10 12 11 10   CREATININE 0.69 0.54 0.57 0.57 0.58 0.52  CALCIUM 9.1 9.1 9.1 9.1 9.3 9.2  MG 2.1  --   --   --   --   --    GFR: Estimated Creatinine Clearance: 126.2 mL/min (by C-G formula based on SCr of 0.52 mg/dL). Liver Function Tests: No results for input(s): AST, ALT, ALKPHOS, BILITOT, PROT, ALBUMIN in the last 168 hours. No results for input(s): LIPASE, AMYLASE in the last 168 hours. No results for input(s): AMMONIA in the last 168 hours. Coagulation Profile: No results for input(s): INR, PROTIME in the last 168 hours. Cardiac Enzymes: No results for input(s): CKTOTAL, CKMB, CKMBINDEX, TROPONINI in the last 168 hours. BNP (last 3 results) No results for input(s): PROBNP in the last 8760 hours. HbA1C: Recent Labs    12/10/16 1112  HGBA1C 5.7*   CBG: Recent Labs  Lab 12/12/16 0753 12/12/16 1229 12/12/16 1708 12/12/16 2126 12/13/16 0712  GLUCAP 79 71 76 92 120*   Lipid Profile: No results for input(s): CHOL, HDL, LDLCALC, TRIG, CHOLHDL, LDLDIRECT in the last 72 hours. Thyroid Function Tests: No results for input(s): TSH, T4TOTAL, FREET4, T3FREE, THYROIDAB in the last 72 hours. Anemia Panel: No results for input(s): VITAMINB12, FOLATE, FERRITIN, TIBC, IRON, RETICCTPCT in the last 72 hours. Urine analysis:    Component Value Date/Time   COLORURINE YELLOW 02/27/2009 2025   APPEARANCEUR CLEAR 02/27/2009 2025   LABSPEC <1.005 (L) 02/27/2009 2025   PHURINE 6.0 02/27/2009 2025   GLUCOSEU NEGATIVE 02/27/2009 2025   HGBUR SMALL (A) 02/27/2009 2025   BILIRUBINUR NEGATIVE 02/27/2009 2025   KETONESUR NEGATIVE 02/27/2009 2025   PROTEINUR NEGATIVE 02/27/2009 2025   UROBILINOGEN 0.2 02/27/2009 2025   NITRITE NEGATIVE 02/27/2009 2025   LEUKOCYTESUR NEGATIVE 02/27/2009 2025   Sepsis  Labs: @LABRCNTIP (procalcitonin:4,lacticidven:4)   Recent Results (from the past 240 hour(s))  MRSA PCR Screening     Status: None   Collection Time: 12/07/16  1:40 PM  Result Value Ref Range Status   MRSA by PCR NEGATIVE NEGATIVE Final    Comment:        The GeneXpert MRSA Assay (FDA approved for NASAL specimens only), is one component of a comprehensive MRSA colonization surveillance program. It is not intended to diagnose MRSA infection nor to guide or monitor treatment for MRSA infections.   Culture, blood (routine x 2)     Status: None (Preliminary result)   Collection Time: 12/10/16 11:11 AM  Result Value Ref Range Status   Specimen Description BLOOD RIGHT ANTECUBITAL  Final   Special Requests   Final    BOTTLES DRAWN AEROBIC AND ANAEROBIC Blood Culture adequate volume   Culture   Final    NO  GROWTH 2 DAYS Performed at [facility], [address] [place], [place] [address]    Report Status PENDING  Incomplete  Culture, blood (routine x 2)     Status: None (Preliminary result)   Collection Time: 12/10/16 11:23 AM  Result Value Ref Range Status   Specimen Description BLOOD RIGHT ANTECUBITAL  Final   Special Requests   Final    BOTTLES DRAWN AEROBIC AND ANAEROBIC Blood Culture adequate volume   Culture   Final    NO GROWTH 2 DAYS Performed at [facility], [address] [place], [place] [address]    Report Status PENDING  Incomplete      Radiology Studies: [place] Limited Joint Space Structures Low Left  Result Date: 12/11/2016 CLINICAL DATA:  41 year old with left [place] extremity cellulitis. Concern for abscess along the anterolateral ankle region. EXAM: ULTRASOUND LEFT [place] EXTREMITY LIMITED TECHNIQUE: Ultrasound examination of the P[place]ifer extremity soft tissues was performed in the area of clinical concern. COMPARISON:  MRI 12/08/2016 FINDINGS: The ankle was evaluated with ultrasound. There is extensive edema along the extensor digitorum longus. However,  there is not a discrete fluid collection. Subcutaneous edema throughout the [place] calf and ankle region. IMPRESSION: Extensive edema along the extensor digitorum longus. Findings are suggestive for tenosynovitis. No discrete abscess. MRI of the ankle may be useful to further evaluate the extent of this inflammation. Electronically Signed   By: Richarda Overlie M.D.   On: 12/11/2016 16:57        Scheduled Meds: . enoxaparin (LOVENOX) injection  40 mg Subcutaneous Q24H  . escitalopram  20 mg Oral Daily  . insulin aspart  0-9 Units Subcutaneous TID WC  . levothyroxine  75 mcg Oral QAC breakfast  . metFORMIN  1,000 mg Oral BID WC  . sodium chloride flush  3 mL Intravenous Q12H   Continuous Infusions: . sodium chloride 250 mL (12/05/16 1603)  . meropenem (MERREM) IV Stopped (12/13/16 0547)  . vancomycin Stopped (12/13/16 0120)     LOS: 8 days    Time spent: 25 minutes  Greater than 50% of the time spent on counseling and coordinating the care.   Manson Passey, MD [facility] Pager [phone]  If 7PM-7AM, please contact night-coverage [email or web address] Password [email or web address] 12/13/2016, 8:57 AM

## 2016-12-14 LAB — CBC
HCT: 36.3 % (ref 36.0–46.0)
HEMOGLOBIN: 11.6 g/dL — AB (ref 12.0–15.0)
MCH: 27.8 pg (ref 26.0–34.0)
MCHC: 32 g/dL (ref 30.0–36.0)
MCV: 87.1 fL (ref 78.0–100.0)
PLATELETS: 507 10*3/uL — AB (ref 150–400)
RBC: 4.17 MIL/uL (ref 3.87–5.11)
RDW: 14.8 % (ref 11.5–15.5)
WBC: 11.7 10*3/uL — ABNORMAL HIGH (ref 4.0–10.5)

## 2016-12-14 LAB — GLUCOSE, CAPILLARY
GLUCOSE-CAPILLARY: 94 mg/dL (ref 65–99)
GLUCOSE-CAPILLARY: 87 mg/dL (ref 65–99)
Glucose-Capillary: 91 mg/dL (ref 65–99)
Glucose-Capillary: 82 mg/dL (ref 65–99)
Glucose-Capillary: 66 mg/dL (ref 65–99)

## 2016-12-14 NOTE — Progress Notes (Signed)
Patient ID: Meghan Wallace, female   DOB: 08/05/1975, 41 y.o.   MRN: [id number]  PROGRESS NOTE    Meghan Wallace  [id number]:[id number] DOB: 11/09/1975 DOA: 12/05/2016  PCP: Jeannie DoneMetz, Louise, MD   Brief Narrative:  41 y.o.femalewith medical history significant ofPCOS, obesity, hypothyroidism comes in with over 2 days of worsening left [place] leg swelling, pain, and redness. She did have a recent ultrasound which was neg for DVT. Pt being referred for admission for worsening cellulitis despite attempt at outpatient treatment with bactrim 1 tab BID.  Pt seen and examined at her bedside. She reports her left [place] extremity pain is well controlled on her pain management. MRI tibia 12/08/16 revealed: Left [place] leg cellulitis. 5 x 8 x 37 mm abscess in the distal left anterolateral [place] leg at the level of the ankle joint.  Ortho surgery following, possible I&D if indicated. [id number]. Blood cx x2 no growth less than 24 hours.  Assessment & Plan:   Acute toxic appearing left Ch[place] extremity cellulitis / Leukocytosis  -MRI left LE:  Left [place]inger leg cellulitis. 5 x 8 x 37 mm abscess in the distal left anterolateral [place] leg at the level of the ankle joint. - IR was consulted for drainage or aspiration on 12/11/2016 - US 12/7 showed extensive edema along the extensor digitorum longus. Findings are suggestive for tenosynovitis. No discrete abscess. MRI of the ankle may be useful to further evaluate the extent of this inflammation - Leukocytosis improving - Cellulitis improving slowly - Ortho is following and recommending continuing current abx therapy - Continue vanco and meropenem   Severe obesity (BMI >= 40) - Counseled on diet and nutrition   PCOS (polycystic ovarian syndrome)-  - Continue metformin - Stable   Thyroid disease - Continue synthroid    DVT prophylaxis: Lovenox subQ Code Status: full code  Family Communication:  No family at the bedside Disposition Plan: discharge once cleared  by ortho    Consultants:   Ortho   Procedures:   None   Antimicrobials:   Meropenem and vanco 12/3 -->   Subjective: No overnight events..   Objective: Vitals:   12/13/16 1420 12/13/16 1544 12/13/16 1952 12/14/16 0603  BP: (!) 110/30 132/69 (!) 123/49 (!) 109/49  Pulse: 67 68 70 (!) 56  Resp: 16  16 16   Temp: 98.3 F (36.8 C)  97.9 F (36.6 C) 97.7 F (36.5 C)  TempSrc: Oral   Oral  SpO2: 100%  98% 100%  Weight:      Height:        Intake/Output Summary (Last 24 hours) at 12/14/2016 0801 Last data filed at 12/13/2016 1421 Gross per 24 hour  Intake 720 ml  Output -  Net 720 ml   Filed Weights   12/05/16 0932  Weight: 124.7 kg (275 lb)    Physical Exam  Constitutional: Appears well-developed and well-nourished. No distress.  CVS: RRR, S1/S2 +, no murmurs, no gallops, no carotid bruit.  Pulmonary: Effort and breath sounds normal, no stridor, rhonchi, wheezes, rales.  Abdominal: Soft. BS +,  no distension, tenderness, rebound or guarding.  Musculoskeletal: Normal range of motion. Edema in LED improving.  Lymphadenopathy: No lymphadenopathy noted, cervical, inguinal. Neuro: Alert. Normal reflexes, muscle tone coordination. No cranial nerve deficit. Skin: LLE rednees Psychiatric: Normal mood and affect. Behavior, judgment, thought content normal.    Data Reviewed: I have personally reviewed following labs and imaging studies  CBC: Recent Labs  Lab 12/08/16 0533 12/09/16 0607 12/10/16 0513 12/11/16 0500 12/12/16  0818 12/13/16 0459 12/14/16 0536  WBC 12.9* 15.8* 14.8* 15.9* 15.2* 13.6* 11.7*  NEUTROABS 9.2* 9.1*  --   --   --   --   --   HGB 11.7* 11.8* 11.0* 11.3* 11.8* 11.7* 11.6*  HCT 35.4* 36.5 34.5* 35.4* 36.7 36.8 36.3  MCV 85.7 87.5 86.9 86.8 87.4 87.8 87.1  PLT 348 573* 560* 604* 593* 576* 507*   Basic Metabolic Panel: Recent Labs  Lab 12/08/16 0533 12/09/16 0607 12/10/16 0513 12/12/16 0818 12/13/16 0459  NA 137 138 136 136 136  K  4.1 4.0 3.8 4.0 4.0  CL 105 102 100* 99* 101  CO2 24 25 27 28 25   GLUCOSE 155* 80 101* 112* 89  BUN 9 10 12 11 10   CREATININE 0.54 0.57 0.57 0.58 0.52  CALCIUM 9.1 9.1 9.1 9.3 9.2   GFR: Estimated Creatinine Clearance: 126.2 mL/min (by C-G formula based on SCr of 0.52 mg/dL). Liver Function Tests: No results for input(s): AST, ALT, ALKPHOS, BILITOT, PROT, ALBUMIN in the last 168 hours. No results for input(s): LIPASE, AMYLASE in the last 168 hours. No results for input(s): AMMONIA in the last 168 hours. Coagulation Profile: No results for input(s): INR, PROTIME in the last 168 hours. Cardiac Enzymes: No results for input(s): CKTOTAL, CKMB, CKMBINDEX, TROPONINI in the last 168 hours. BNP (last 3 results) No results for input(s): PROBNP in the last 8760 hours. HbA1C: No results for input(s): HGBA1C in the last 72 hours. CBG: Recent Labs  Lab 12/13/16 0712 12/13/16 1200 12/13/16 1656 12/13/16 2108 12/14/16 0744  GLUCAP 120* 78 69 91 82   Lipid Profile: No results for input(s): CHOL, HDL, LDLCALC, TRIG, CHOLHDL, LDLDIRECT in the last 72 hours. Thyroid Function Tests: No results for input(s): TSH, T4TOTAL, FREET4, T3FREE, THYROIDAB in the last 72 hours. Anemia Panel: No results for input(s): VITAMINB12, FOLATE, FERRITIN, TIBC, IRON, RETICCTPCT in the last 72 hours. Urine analysis:    Component Value Date/Time   COLORURINE YELLOW 02/27/2009 2025   APPEARANCEUR CLEAR 02/27/2009 2025   LABSPEC <1.005 (L) 02/27/2009 2025   PHURINE 6.0 02/27/2009 2025   GLUCOSEU NEGATIVE 02/27/2009 2025   HGBUR SMALL (A) 02/27/2009 2025   BILIRUBINUR NEGATIVE 02/27/2009 2025   KETONESUR NEGATIVE 02/27/2009 2025   PROTEINUR NEGATIVE 02/27/2009 2025   UROBILINOGEN 0.2 02/27/2009 2025   NITRITE NEGATIVE 02/27/2009 2025   LEUKOCYTESUR NEGATIVE 02/27/2009 2025   Sepsis Labs: @LABRCNTIP (procalcitonin:4,lacticidven:4)   Recent Results (from the past 240 hour(s))  MRSA PCR Screening      Status: None   Collection Time: 12/07/16  1:40 PM  Result Value Ref Range Status   MRSA by PCR NEGATIVE NEGATIVE Final    Comment:        The GeneXpert MRSA Assay (FDA approved for NASAL specimens only), is one component of a comprehensive MRSA colonization surveillance program. It is not intended to diagnose MRSA infection nor to guide or monitor treatment for MRSA infections.   Culture, blood (routine x 2)     Status: None (Preliminary result)   Collection Time: 12/10/16 11:11 AM  Result Value Ref Range Status   Specimen Description BLOOD RIGHT ANTECUBITAL  Final   Special Requests   Final    BOTTLES DRAWN AEROBIC AND ANAEROBIC Blood Culture adequate volume   Culture   Final    NO GROWTH 3 DAYS Performed at [facility], [address] [place], [place] [address]    Report Status PENDING  Incomplete  Culture, blood (routine  x 2)     Status: None (Preliminary result)   Collection Time: 12/10/16 11:23 AM  Result Value Ref Range Status   Specimen Description BLOOD RIGHT ANTECUBITAL  Final   Special Requests   Final    BOTTLES DRAWN AEROBIC AND ANAEROBIC Blood Culture adequate volume   Culture   Final    NO GROWTH 3 DAYS Performed at [facility], [address] [place], [place] [address]    Report Status PENDING  Incomplete      Radiology Studies: [place] Limited Joint Space Structures Low Left  Result Date: 12/11/2016 CLINICAL DATA:  41 year old with left [place] extremity cellulitis. Concern for abscess along the anterolateral ankle region. EXAM: ULTRASOUND LEFT [place] EXTREMITY LIMITED TECHNIQUE: Ultrasound examination of the [place] extremity soft tissues was performed in the area of clinical concern. COMPARISON:  MRI 12/08/2016 FINDINGS: The ankle was evaluated with ultrasound. There is extensive edema along the extensor digitorum longus. However, there is not a discrete fluid collection. Subcutaneous edema throughout the [place] calf and ankle region. IMPRESSION:  Extensive edema along the extensor digitorum longus. Findings are suggestive for tenosynovitis. No discrete abscess. MRI of the ankle may be useful to further evaluate the extent of this inflammation. Electronically Signed   By: Richarda OverlieAdam  Henn M.D.   On: 12/11/2016 16:57        Scheduled Meds: . enoxaparin (LOVENOX) injection  40 mg Subcutaneous Q24H  . escitalopram  20 mg Oral Daily  . insulin aspart  0-9 Units Subcutaneous TID WC  . levothyroxine  75 mcg Oral QAC breakfast  . metFORMIN  1,000 mg Oral BID WC  . sodium chloride flush  3 mL Intravenous Q12H   Continuous Infusions: . sodium chloride 250 mL (12/05/16 1603)  . meropenem (MERREM) IV 1 g (12/14/16 0629)  . vancomycin Stopped (12/14/16 0130)     LOS: 9 days    Time spent: 25 minutes  Greater than 50% of the time spent on counseling and coordinating the care.   Manson PasseyAlma Lauris Serviss, MD [facility] Pager [phone]  If 7PM-7AM, please contact night-coverage [email or web address] Password [email or web address] 12/14/2016, 8:01 AM

## 2016-12-15 LAB — CBC
HCT: 34.2 % — ABNORMAL LOW (ref 36.0–46.0)
HEMOGLOBIN: 10.9 g/dL — AB (ref 12.0–15.0)
MCH: 27.7 pg (ref 26.0–34.0)
MCHC: 31.9 g/dL (ref 30.0–36.0)
MCV: 87 fL (ref 78.0–100.0)
PLATELETS: 488 10*3/uL — AB (ref 150–400)
RBC: 3.93 MIL/uL (ref 3.87–5.11)
RDW: 14.5 % (ref 11.5–15.5)
WBC: 9.4 10*3/uL (ref 4.0–10.5)

## 2016-12-15 LAB — GLUCOSE, CAPILLARY
GLUCOSE-CAPILLARY: 68 mg/dL (ref 65–99)
Glucose-Capillary: 77 mg/dL (ref 65–99)

## 2016-12-15 LAB — BASIC METABOLIC PANEL
ANION GAP: 10 (ref 5–15)
BUN: 12 mg/dL (ref 6–20)
CHLORIDE: 104 mmol/L (ref 101–111)
CO2: 25 mmol/L (ref 22–32)
Calcium: 9.5 mg/dL (ref 8.9–10.3)
Creatinine, Ser: 0.45 mg/dL (ref 0.44–1.00)
Glucose, Bld: 90 mg/dL (ref 65–99)
POTASSIUM: 3.9 mmol/L (ref 3.5–5.1)
SODIUM: 139 mmol/L (ref 135–145)

## 2016-12-15 LAB — CULTURE, BLOOD (ROUTINE X 2)
Culture: NO GROWTH
Culture: NO GROWTH
SPECIAL REQUESTS: ADEQUATE
Special Requests: ADEQUATE

## 2016-12-15 MED ORDER — ACETAMINOPHEN 325 MG PO TABS: 650.0000 mg | ORAL_TABLET | Freq: Four times a day (QID) | ORAL | 0 refills | Status: DC | PRN

## 2016-12-15 MED ORDER — CLINDAMYCIN HCL 300 MG PO CAPS: 300.0000 mg | ORAL_CAPSULE | Freq: Three times a day (TID) | ORAL | 0 refills | Status: AC

## 2016-12-15 NOTE — Discharge Summary (Signed)
Physician Discharge Summary  Kailah Pennel ZOX:[id number] DOB: April 06, 1975 DOA: 12/05/2016  PCP: Jeannie Done, MD  Admit date: 12/05/2016 Discharge date: 12/15/2016  Recommendations for Outpatient Follow-up:  Continue clindamycin for 10 days on discharge  Discharge Diagnoses:  Principal Problem:   Cellulitis Active Problems:   Severe obesity (BMI >= 40) (HCC)   PCOS (polycystic ovarian syndrome)   Hyperlipidemia   Thyroid disease    Discharge Condition: stable   Diet recommendation: as tolerated   History of present illness:  41 y.o.femalewith medical history significant ofPCOS, obesity, hypothyroidism comes in with over 2 days of worsening left Saiki leg swelling, pain, and redness. She did have a recent ultrasound which was neg for DVT. Pt being referred for admission for worsening cellulitis despite attempt at outpatient treatment with bactrim 1 tab BID.  Pt seen and examined at her bedside. She reports her left Pichette extremity pain is well controlled on her pain management. MRI tibia 12/08/16 revealed: Left Loughmiller leg cellulitis. 5 x 8 x 37 mm abscess in the distal left anterolateral Mennella leg at the level of the ankle joint.   Hospital Course:   Assessment & Plan:   Acute toxic appearing left Hickson extremity cellulitis / Leukocytosis  -MRI left LE: Left Furlan leg cellulitis. 5 x 8 x 37 mm abscess in the distal left anterolateral Pasternak leg at the level of the ankle joint. - IR was consulted for drainage or aspiration on 12/11/2016 - [place] 12/7 showed extensive edema along the extensor digitorum longus. Findings are suggestive for tenosynovitis. No discrete abscess. MRI of the ankle may be useful to further evaluate the extent of this inflammation - Leukocytosis improving - Cellulitis improving slowly - Ortho is following and recommending continuing abx therapy - Stop vanco and meropenem - Continue clinda for 10 days on discharge   Severe obesity (BMI >= 40) -  Counseled on diet and nutrition   PCOS (polycystic ovarian syndrome)-  - Continue metformin - Stable    Thyroid disease - Continue synthroid    DVT prophylaxis: Lovenox subQ Code Status: full code  Family Communication:  No family at the bedside     Consultants:   Ortho   Procedures:   None   Antimicrobials:   Meropenem and vanco 12/3 --> 12/15/2016    Signed:  Manson Passey, MD  [facility] 12/15/2016, 11:30 AM  Pager #: [phone]  Time spent in minutes: more than 30 minutes   Discharge Exam: Vitals:   12/14/16 2109 12/15/16 0456  BP: (!) 115/42 (!) 108/45  Pulse: 64 (!) 53  Resp: 17 16  Temp: 97.7 F (36.5 C) 97.8 F (36.6 C)  SpO2: 97% 98%   Vitals:   12/14/16 0603 12/14/16 1357 12/14/16 2109 12/15/16 0456  BP: (!) 109/49 (!) 121/53 (!) 115/42 (!) 108/45  Pulse: (!) 56 67 64 (!) 53  Resp: 16 16 17 16   Temp: 97.7 F (36.5 C) 98.1 F (36.7 C) 97.7 F (36.5 C) 97.8 F (36.6 C)  TempSrc: Oral Oral Oral Oral  SpO2: 100% 100% 97% 98%  Weight:      Height:        General: Pt is alert, follows commands appropriately, not in acute distress Cardiovascular: Regular rate and rhythm, S1/S2 + Respiratory: Clear to auscultation bilaterally, no wheezing, no crackles, no rhonchi Abdominal: Soft, non tender, non distended, bowel sounds +, no guarding Extremities: LLE cellulitis improving, palpable pulses  Neuro: Grossly nonfocal  Discharge Instructions  Discharge Instructions    Call  MD for:  persistant nausea and vomiting   Complete by:  As directed    Call MD for:  redness, tenderness, or signs of infection (pain, swelling, redness, odor or green/yellow discharge around incision site)   Complete by:  As directed    Call MD for:  severe uncontrolled pain   Complete by:  As directed    Diet - low sodium heart healthy   Complete by:  As directed    Discharge instructions   Complete by:  As directed    Continue clindamycin for  10 days on discharge   Increase activity slowly   Complete by:  As directed      Allergies as of 12/15/2016      Reactions   Other    Other reaction(s): Other (See Comments) Fresh fruits ( apples, pears, and plums ) makes throat itch   Penicillin G Hives   Has patient had a PCN reaction causing immediate rash, facial/tongue/throat swelling, SOB or lightheadedness with hypotension: {yes Has patient had a PCN reaction causing severe rash involving mucus membranes or skin necrosis: no Has patient had a PCN reaction that required hospitalization: no Has patient had a PCN reaction occurring within the last 10 years: yes If all of the above answers are "NO", then may proceed with Cephalosporin use.      Medication List    STOP taking these medications   mometasone-formoterol 100-5 MCG/ACT Aero Commonly known as:  DULERA   predniSONE 10 MG tablet Commonly known as:  DELTASONE   sulfamethoxazole-trimethoprim 800-160 MG tablet Commonly known as:  BACTRIM DS,SEPTRA DS     TAKE these medications   acetaminophen 325 MG tablet Commonly known as:  TYLENOL Take 2 tablets (650 mg total) by mouth every 6 (six) hours as needed for mild pain (or Fever >/= 101).   clindamycin 300 MG capsule Commonly known as:  CLEOCIN Take 1 capsule (300 mg total) by mouth 3 (three) times daily for 10 days.   escitalopram 20 MG tablet Commonly known as:  LEXAPRO Take 20 mg by mouth daily.   fenofibrate 160 MG tablet TAKE 1 TABLET WITH A MEAL ONCE A DAY   levothyroxine 75 MCG tablet Commonly known as:  SYNTHROID, LEVOTHROID Take 75 mcg by mouth daily before breakfast.   metFORMIN 1000 MG tablet Commonly known as:  GLUCOPHAGE Take 1,000 mg by mouth 2 (two) times daily with a meal.   PREGNITUDE 2000-200 MG-MCG Pack Generic drug:  Inositol-Folic Acid Take by mouth daily.   VITAMIN D PO Take 1,000 mg by mouth 2 (two) times daily.      Follow-up Information    Jeannie Done, MD. Schedule an  appointment as soon as possible for a visit.   Specialty:  Internal Medicine Contact information: [address] [place] [address] [phone]            The results of significant diagnostics from this hospitalization (including imaging, microbiology, ancillary and laboratory) are listed below for reference.    Significant Diagnostic Studies: Mr Tibia Fibula Left W Wo Contrast  Addendum Date: 12/11/2016   ADDENDUM REPORT: 12/11/2016 16:54 ADDENDUM: Ultrasound performed today demonstrates no focal fluid collection in the soft tissues. There is thickening of the extensor digitorum tendon with echogenic material within the tendon sheath at the site of the peripherally enhancing fluid collection in the lateral left Albrecht leg/ankle. Given these findings the appearance is concerning for infectious tenosynovitis of the extensor digitorum longus tendon sheath. MR ankle without  and with intravenous contrast is recommended to evaluate the extent of the abnormality and to better characterize the abnormality given that is dated at the inferior-most aspect of the current examination. Electronically Signed   By: Elige KoHetal  Patel   On: 12/11/2016 16:54   Result Date: 12/11/2016 CLINICAL DATA:  Soft tissue swelling of the left Bahar leg. EXAM: MRI OF [place] LEFT EXTREMITY WITHOUT AND WITH CONTRAST TECHNIQUE: Multiplanar, multisequence MR imaging of the left Nosal leg was performed both before and after administration of intravenous contrast. CONTRAST:  20mL MULTIHANCE GADOBENATE DIMEGLUMINE 529 MG/ML IV SOLN COMPARISON:  None. FINDINGS: Bones/Joint/Cartilage No marrow signal abnormality. No fracture or dislocation. Normal alignment. No joint effusion. No periosteal reaction or bone destruction. Ligaments Collateral ligaments are intact. Muscles and Tendons Muscles are normal. No muscle edema or muscle atrophy. No intramuscular fluid collection or hematoma. Soft tissue Soft tissue edema in the subcutaneous fat  circumferentially around the left Markus leg with mild heterogeneous enhancement most concerning for cellulitis. 5 x 8 x 37 mm peripheral enhancing fluid collection in the distal left anterolateral Kuehne leg at the level of the ankle joint concerning for a small abscess. IMPRESSION: 1. Left Kozinski leg cellulitis. 5 x 8 x 37 mm abscess in the distal left anterolateral Hammerschmidt leg at the level of the ankle joint. Electronically Signed: By: Elige KoHetal  Patel On: 12/08/2016 15:24   [place] Limited Joint Space Structures Low Left  Result Date: 12/11/2016 CLINICAL DATA:  41 year old with left Hoban extremity cellulitis. Concern for abscess along the anterolateral ankle region. EXAM: ULTRASOUND LEFT Tolleson EXTREMITY LIMITED TECHNIQUE: Ultrasound examination of the Miltenberger extremity soft tissues was performed in the area of clinical concern. COMPARISON:  MRI 12/08/2016 FINDINGS: The ankle was evaluated with ultrasound. There is extensive edema along the extensor digitorum longus. However, there is not a discrete fluid collection. Subcutaneous edema throughout the Glowacki calf and ankle region. IMPRESSION: Extensive edema along the extensor digitorum longus. Findings are suggestive for tenosynovitis. No discrete abscess. MRI of the ankle may be useful to further evaluate the extent of this inflammation. Electronically Signed   By: Richarda OverlieAdam  Henn M.D.   On: 12/11/2016 16:57    Microbiology: Recent Results (from the past 240 hour(s))  MRSA PCR Screening     Status: None   Collection Time: 12/07/16  1:40 PM  Result Value Ref Range Status   MRSA by PCR NEGATIVE NEGATIVE Final    Comment:        The GeneXpert MRSA Assay (FDA approved for NASAL specimens only), is one component of a comprehensive MRSA colonization surveillance program. It is not intended to diagnose MRSA infection nor to guide or monitor treatment for MRSA infections.   Culture, blood (routine x 2)     Status: None (Preliminary result)   Collection Time:  12/10/16 11:11 AM  Result Value Ref Range Status   Specimen Description BLOOD RIGHT ANTECUBITAL  Final   Special Requests   Final    BOTTLES DRAWN AEROBIC AND ANAEROBIC Blood Culture adequate volume   Culture   Final    NO GROWTH 4 DAYS Performed at [facility], [address] [place], [place] [address]    Report Status PENDING  Incomplete  Culture, blood (routine x 2)     Status: None (Preliminary result)   Collection Time: 12/10/16 11:23 AM  Result Value Ref Range Status   Specimen Description BLOOD RIGHT ANTECUBITAL  Final   Special Requests   Final    BOTTLES  DRAWN AEROBIC AND ANAEROBIC Blood Culture adequate volume   Culture   Final    NO GROWTH 4 DAYS Performed at [facility], [address] [place], [place] [address]    Report Status PENDING  Incomplete     Labs: Basic Metabolic Panel: Recent Labs  Lab 12/09/16 0607 12/10/16 0513 12/12/16 0818 12/13/16 0459 12/15/16 0539  NA 138 136 136 136 139  K 4.0 3.8 4.0 4.0 3.9  CL 102 100* 99* 101 104  CO2 25 27 28 25 25   GLUCOSE 80 101* 112* 89 90  BUN 10 12 11 10 12   CREATININE 0.57 0.57 0.58 0.52 0.45  CALCIUM 9.1 9.1 9.3 9.2 9.5   Liver Function Tests: No results for input(s): AST, ALT, ALKPHOS, BILITOT, PROT, ALBUMIN in the last 168 hours. No results for input(s): LIPASE, AMYLASE in the last 168 hours. No results for input(s): AMMONIA in the last 168 hours. CBC: Recent Labs  Lab 12/09/16 0607  12/11/16 0500 12/12/16 0818 12/13/16 0459 12/14/16 0536 12/15/16 0539  WBC 15.8*   < > 15.9* 15.2* 13.6* 11.7* 9.4  NEUTROABS 9.1*  --   --   --   --   --   --   HGB 11.8*   < > 11.3* 11.8* 11.7* 11.6* 10.9*  HCT 36.5   < > 35.4* 36.7 36.8 36.3 34.2*  MCV 87.5   < > 86.8 87.4 87.8 87.1 87.0  PLT 573*   < > 604* 593* 576* 507* 488*   < > = values in this interval not displayed.   Cardiac Enzymes: No results for input(s): CKTOTAL, CKMB, CKMBINDEX, TROPONINI in the last 168 hours. BNP: BNP (last 3  results) No results for input(s): BNP in the last 8760 hours.  ProBNP (last 3 results) No results for input(s): PROBNP in the last 8760 hours.  CBG: Recent Labs  Lab 12/14/16 0744 12/14/16 1202 12/14/16 1707 12/14/16 2106 12/15/16 0752  GLUCAP 82 66 94 87 77

## 2016-12-15 NOTE — Progress Notes (Signed)
Date: December 15, 2016 Chart review for discharge needs:  None found for case management. Patient has no questions concerning post hospital care.

## 2016-12-15 NOTE — Discharge Instructions (Signed)
Cellulitis, Adult °Cellulitis is a skin infection. The infected area is usually red and sore. This condition occurs most often in the arms and Menard legs. It is very important to get treated for this condition. °Follow these instructions at home: °· Take over-the-counter and prescription medicines only as told by your doctor. °· If you were prescribed an antibiotic medicine, take it as told by your doctor. Do not stop taking the antibiotic even if you start to feel better. °· Drink enough fluid to keep your pee (urine) clear or pale yellow. °· Do not touch or rub the infected area. °· Raise (elevate) the infected area above the level of your heart while you are sitting or lying down. °· Place warm or cold wet cloths (warm or cold compresses) on the infected area. Do this as told by your doctor. °· Keep all follow-up visits as told by your doctor. This is important. These visits let your doctor make sure your infection is not getting worse. °Contact a doctor if: °· You have a fever. °· Your symptoms do not get better after 1-2 days of treatment. °· Your bone or joint under the infected area starts to hurt after the skin has healed. °· Your infection comes back. This can happen in the same area or another area. °· You have a swollen bump in the infected area. °· You have new symptoms. °· You feel ill and also have muscle aches and pains. °Get help right away if: °· Your symptoms get worse. °· You feel very sleepy. °· You throw up (vomit) or have watery poop (diarrhea) for a long time. °· There are red streaks coming from the infected area. °· Your red area gets larger. °· Your red area turns darker. °This information is not intended to replace advice given to you by your health care provider. Make sure you discuss any questions you have with your health care provider. °Document Released: 06/10/2007 Document Revised: 05/30/2015 Document Reviewed: 10/31/2014 °Elsevier Interactive Patient Education © 2018 [employer]  [employer] ° °

## 2016-12-15 NOTE — Progress Notes (Signed)
  Date:  December 15, 2016 Chart reviewed for concurrent status and case management needs.  Will continue to follow patient progress.  Discharge Planning: following for needs  Expected discharge date: December 18, 2016  Alisah Grandberry, BSN, RN3, CCM   [phone]  

## 2017-04-09 ENCOUNTER — Other Ambulatory Visit (HOSPITAL_COMMUNITY): Payer: Self-pay | Admitting: Surgery

## 2017-04-09 ENCOUNTER — Ambulatory Visit (HOSPITAL_COMMUNITY)
Admission: RE | Admit: 2017-04-09 | Discharge: 2017-04-09 | Disposition: A | Discharge status: Home / Self Care | Payer: BLUE CROSS/BLUE SHIELD | Source: Ambulatory Visit | Attending: Vascular Surgery | Admitting: Vascular Surgery

## 2017-04-09 DIAGNOSIS — I872 Venous insufficiency (chronic) (peripheral): Secondary | ICD-10-CM | POA: Diagnosis not present

## 2017-04-09 DIAGNOSIS — I878 Other specified disorders of veins: Secondary | ICD-10-CM | POA: Insufficient documentation

## 2018-01-01 IMAGING — US US EXTREM LOW*L* LIMITED
1 series · 13 of 13 positions shown · non-contrast
Comparison: MRI 12/08/2016

CLINICAL DATA: 41-year-old with left lower extremity cellulitis.
Concern for abscess along the anterolateral ankle region.

EXAM:
ULTRASOUND LEFT LOWER EXTREMITY LIMITED
TECHNIQUE: Ultrasound examination of the lower extremity soft tissues was
performed in the area of clinical concern.

[Series 1: us extrem low*left* limited · 0.06mm/px · 13 of 13 slices shown]
[im 1/13]
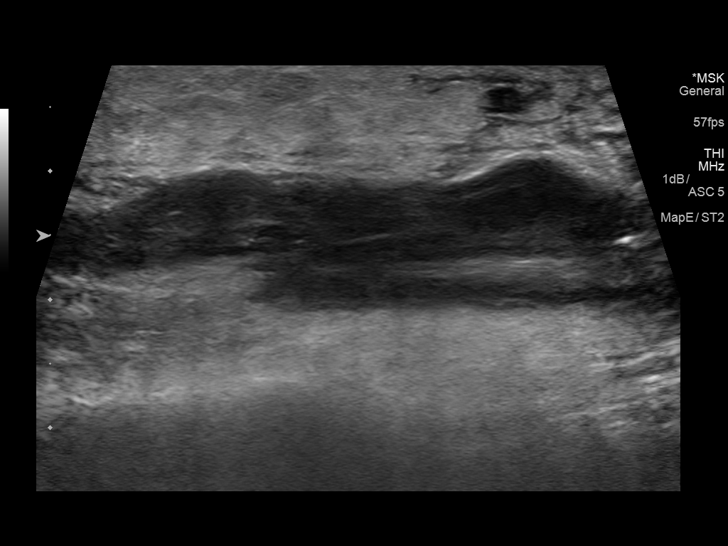
[im 2/13]
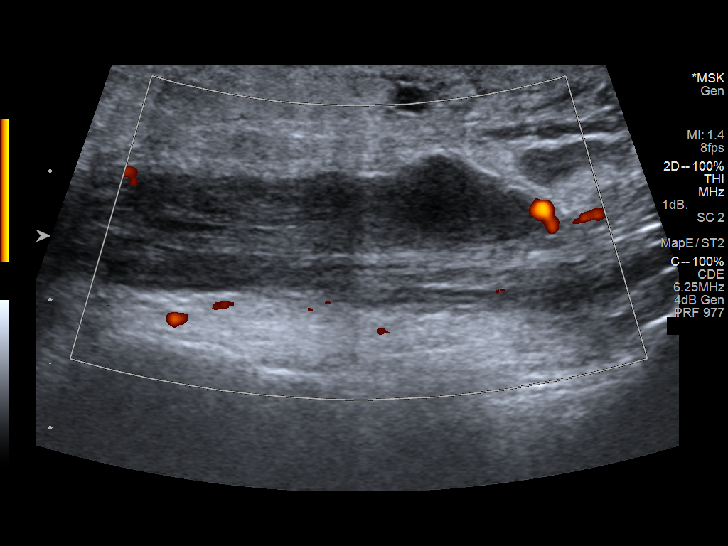
[im 3/13]
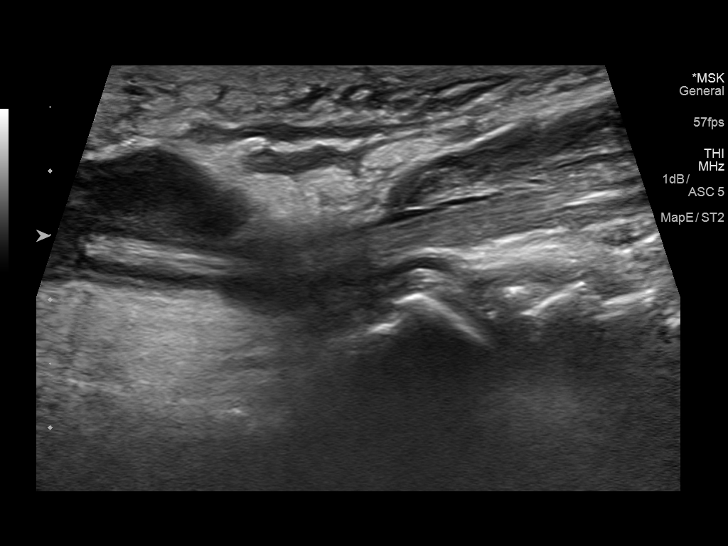
[im 4/13]
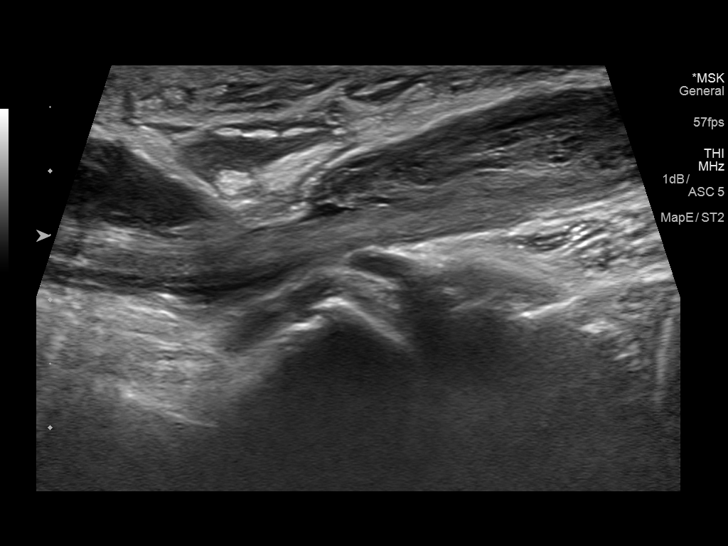
[im 5/13]
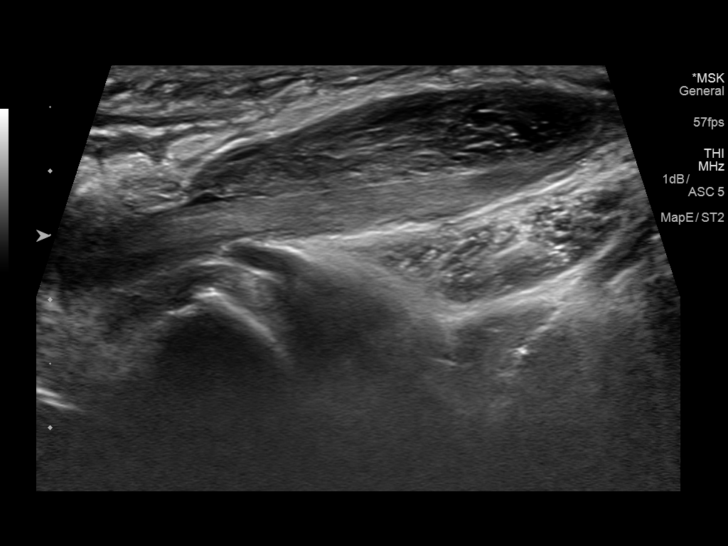
[im 6/13]
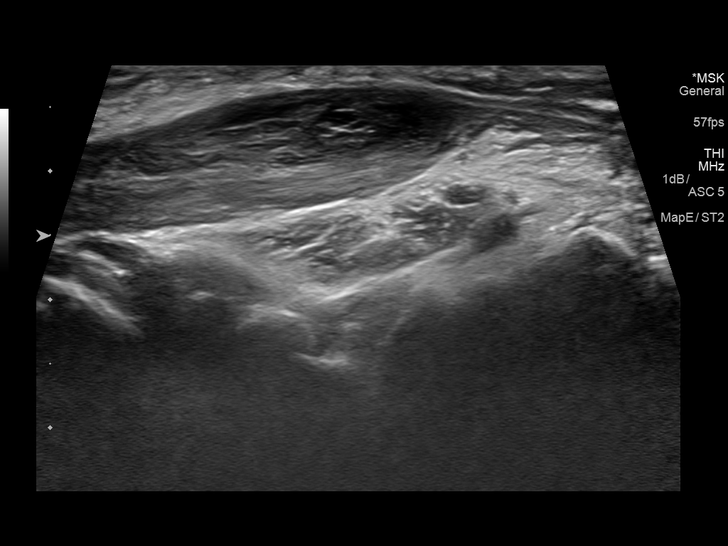
[im 7/13]
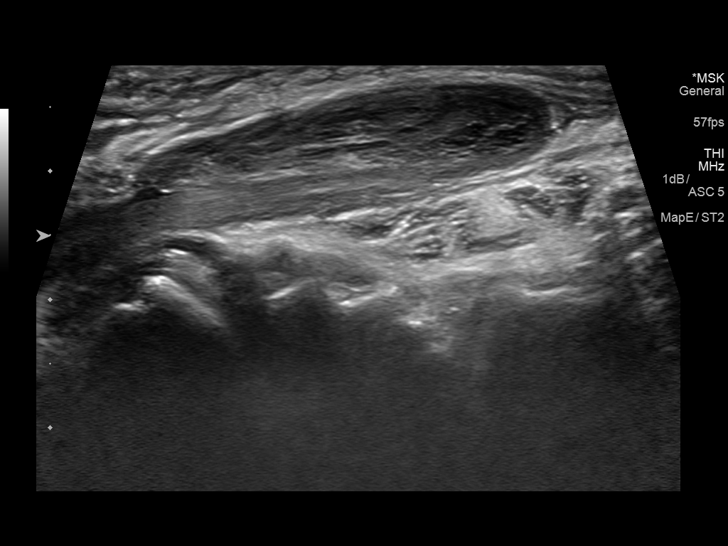
[im 8/13]
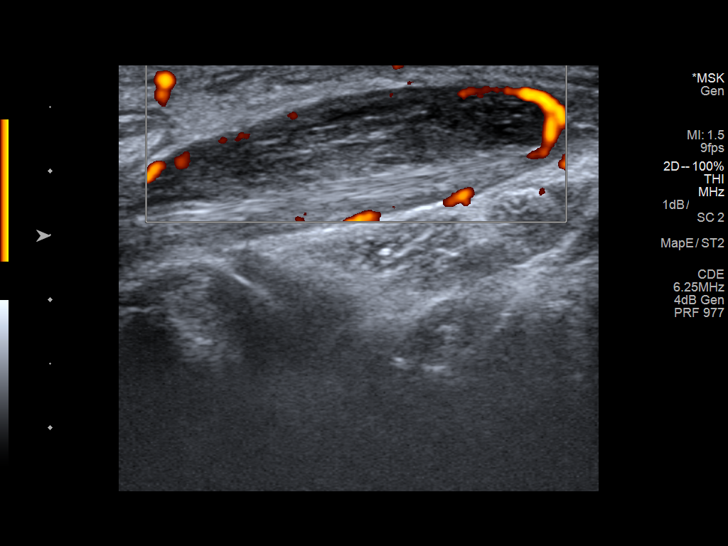
[im 9/13]
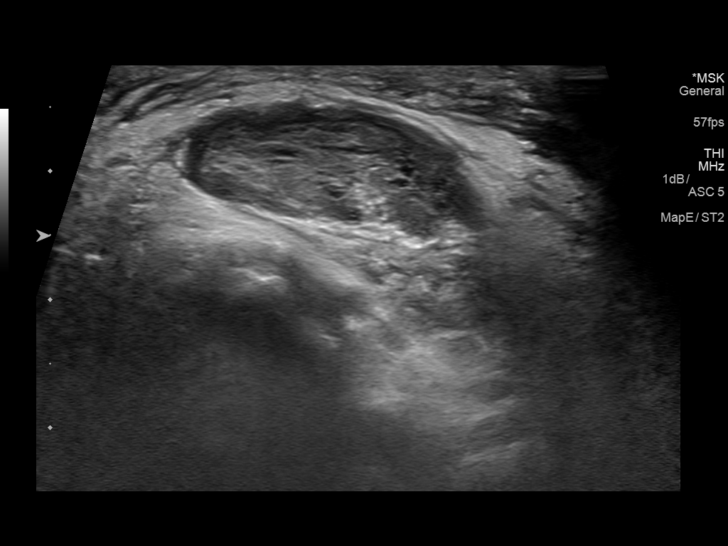
[im 10/13]
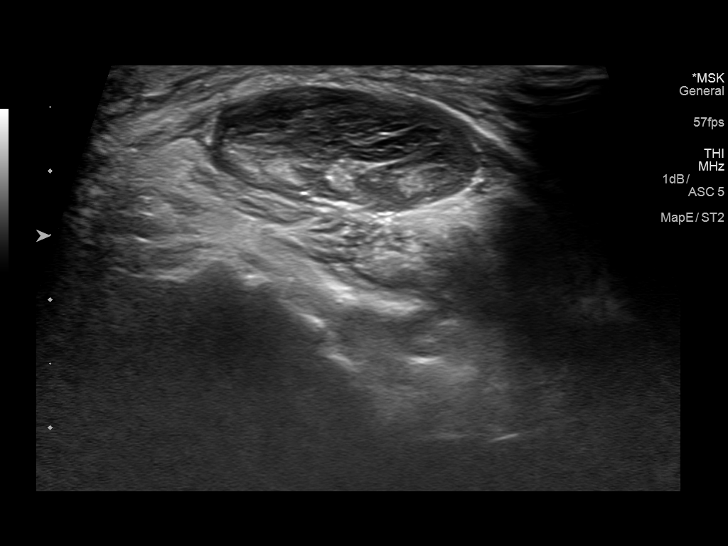
[im 11/13]
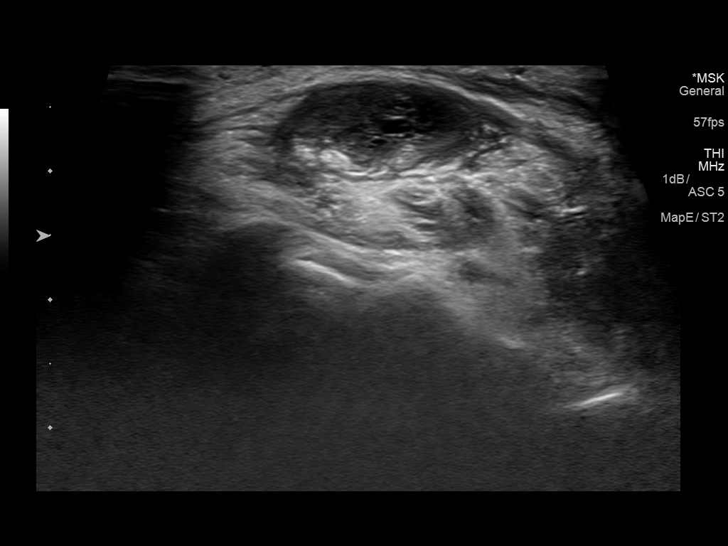
[im 12/13]
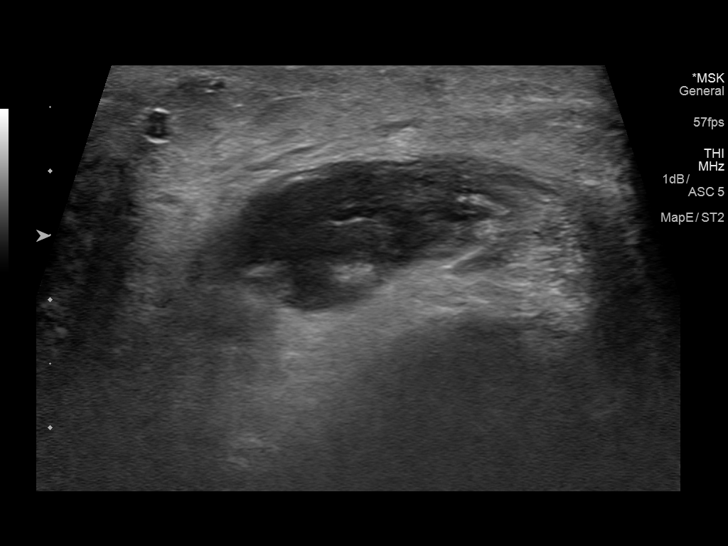
[im 13/13]
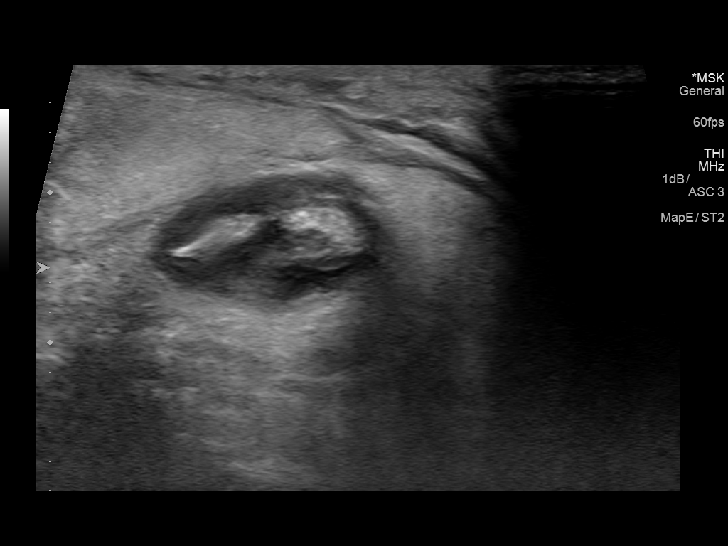

[13 of 13 positions shown; findings below may reference images not displayed]

FINDINGS: The ankle was evaluated with ultrasound. There is extensive edema
along the extensor digitorum longus. However, there is not a
discrete fluid collection. Subcutaneous edema throughout the lower
calf and ankle region.
IMPRESSION: Extensive edema along the extensor digitorum longus. Findings are
suggestive for tenosynovitis. No discrete abscess. MRI of the ankle
may be useful to further evaluate the extent of this inflammation.

## 2019-08-01 ENCOUNTER — Other Ambulatory Visit: Payer: Self-pay

## 2019-08-01 ENCOUNTER — Encounter (HOSPITAL_COMMUNITY): Payer: Self-pay | Admitting: Emergency Medicine

## 2019-08-01 DIAGNOSIS — R1013 Epigastric pain: Secondary | ICD-10-CM | POA: Insufficient documentation

## 2019-08-01 DIAGNOSIS — R197 Diarrhea, unspecified: Secondary | ICD-10-CM | POA: Insufficient documentation

## 2019-08-01 DIAGNOSIS — E669 Obesity, unspecified: Secondary | ICD-10-CM | POA: Insufficient documentation

## 2019-08-01 DIAGNOSIS — R112 Nausea with vomiting, unspecified: Secondary | ICD-10-CM | POA: Diagnosis not present

## 2019-08-01 DIAGNOSIS — Z20822 Contact with and (suspected) exposure to covid-19: Secondary | ICD-10-CM | POA: Insufficient documentation

## 2019-08-01 LAB — URINALYSIS, ROUTINE W REFLEX MICROSCOPIC
Bilirubin Urine: NEGATIVE
Glucose, UA: NEGATIVE mg/dL
Ketones, ur: NEGATIVE mg/dL
Nitrite: NEGATIVE
Protein, ur: NEGATIVE mg/dL
Specific Gravity, Urine: <1.005 — ABNORMAL LOW (ref 1.005–1.030)
pH: 6 (ref 5.0–8.0)

## 2019-08-01 LAB — COMPREHENSIVE METABOLIC PANEL
ALT: 16 U/L (ref 0–44)
AST: 22 U/L (ref 15–41)
Albumin: 4.3 g/dL (ref 3.5–5.0)
Alkaline Phosphatase: 59 U/L (ref 38–126)
Anion gap: 16 — ABNORMAL HIGH (ref 5–15)
BUN: 8 mg/dL (ref 6–20)
CO2: 17 mmol/L — ABNORMAL LOW (ref 22–32)
Calcium: 9.4 mg/dL (ref 8.9–10.3)
Chloride: 107 mmol/L (ref 98–111)
Creatinine, Ser: 0.4 mg/dL — ABNORMAL LOW (ref 0.44–1.00)
GFR calc Af Amer: >60 mL/min (ref >60)
GFR calc non Af Amer: >60 mL/min (ref >60)
Glucose, Bld: 101 mg/dL — ABNORMAL HIGH (ref 70–99)
Potassium: 4.1 mmol/L (ref 3.5–5.1)
Sodium: 140 mmol/L (ref 135–145)
Total Bilirubin: 1 mg/dL (ref 0.3–1.2)
Total Protein: 7.8 g/dL (ref 6.5–8.1)

## 2019-08-01 LAB — LIPASE, BLOOD: Lipase: 24 U/L (ref 11–51)

## 2019-08-01 LAB — CBC
HCT: 44.9 % (ref 36.0–46.0)
Hemoglobin: 14.4 g/dL (ref 12.0–15.0)
MCH: 28.1 pg (ref 26.0–34.0)
MCHC: 32.1 g/dL (ref 30.0–36.0)
MCV: 87.7 fL (ref 80.0–100.0)
Platelets: 383 10*3/uL (ref 150–400)
RBC: 5.12 MIL/uL — ABNORMAL HIGH (ref 3.87–5.11)
RDW: 14 % (ref 11.5–15.5)
WBC: 10.9 10*3/uL — ABNORMAL HIGH (ref 4.0–10.5)
nRBC: 0 % (ref 0.0–0.2)

## 2019-08-01 LAB — URINALYSIS, MICROSCOPIC (REFLEX): Bacteria, UA: NONE SEEN

## 2019-08-01 LAB — I-STAT BETA HCG BLOOD, ED (MC, WL, AP ONLY): I-stat hCG, quantitative: <5 m[IU]/mL (ref <5)

## 2019-08-01 MED ORDER — SODIUM CHLORIDE 0.9% FLUSH: 3.0000 mL | Freq: Once | INTRAVENOUS | Status: DC

## 2019-08-01 NOTE — ED Notes (Signed)
ISTAT beta negative.

## 2019-08-01 NOTE — ED Triage Notes (Signed)
Patient complaining of abdominal pain, fever, and diarrhea. Patient states this started 07/28/2019.

## 2019-08-02 ENCOUNTER — Emergency Department (HOSPITAL_COMMUNITY): Payer: BC Managed Care – PPO

## 2019-08-02 ENCOUNTER — Emergency Department (HOSPITAL_COMMUNITY)
Admission: EM | Admit: 2019-08-02 | Discharge: 2019-08-02 | Disposition: A | Discharge status: Home / Self Care | Payer: BC Managed Care – PPO | Attending: Emergency Medicine | Admitting: Emergency Medicine

## 2019-08-02 DIAGNOSIS — R197 Diarrhea, unspecified: Secondary | ICD-10-CM

## 2019-08-02 LAB — SARS CORONAVIRUS 2 BY RT PCR (HOSPITAL ORDER, PERFORMED IN ~~LOC~~ HOSPITAL LAB): SARS Coronavirus 2: NEGATIVE

## 2019-08-02 MED ORDER — ONDANSETRON HCL 4 MG/2ML IJ SOLN
4.0000 mg | Freq: Once | INTRAMUSCULAR | Status: AC
  Administered 2019-08-02: 4 mg via INTRAVENOUS
  Filled 2019-08-02: qty 2

## 2019-08-02 MED ORDER — SODIUM CHLORIDE 0.9 % IV BOLUS
1000.0000 mL | Freq: Once | INTRAVENOUS | Status: AC
  Administered 2019-08-02: 1000 mL via INTRAVENOUS

## 2019-08-02 MED ORDER — MORPHINE SULFATE (PF) 4 MG/ML IV SOLN
4.0000 mg | Freq: Once | INTRAVENOUS | Status: AC
  Administered 2019-08-02: 4 mg via INTRAVENOUS
  Filled 2019-08-02: qty 1

## 2019-08-02 MED ORDER — ONDANSETRON HCL 4 MG PO TABS
4.0000 mg | ORAL_TABLET | Freq: Three times a day (TID) | ORAL | 0 refills | Status: DC | PRN
Start: 2019-08-02 — End: 2023-01-18

## 2019-08-02 NOTE — ED Provider Notes (Signed)
[facility]-EMERGENCY DEPT Provider Note   CSN: [id number] Arrival date & time: 08/01/19  2014     History Chief Complaint  Patient presents with   Abdominal Pain    Meghan Wallace is a 44 y.o. female.  The history is provided by the patient. No language interpreter was used.  Abdominal Pain    44 year old female with history of PCOS, thyroid disease, depression, obesity, presenting for evaluation of abdominal pain.  Patient report for the past 5 days she has had gradual onset of abdominal discomfort with associated nausea vomiting and diarrhea.  She described pain as a cramping sensation, worse after eating, localized in the upper abdomen.  She also endorsed feeling nauseous, occasional nonbloody nonbilious vomiting.  She also endorsed having multiple episodes of loose stool without blood or mucus.  Pain is mild improved with Tylenol and ibuprofen.  She also endorsed a fever as high as 101 yesterday.  She did endorse a mild headache.  Denies loss of taste or smell, no dysuria hematuria no vaginal bleeding or vaginal discharge.  She denies any recent antibiotic use denies any recent travel or eating exotic food.  No recent sick contact.  Denies alcohol or tobacco abuse.  She has had a Covid vaccination.  Past Medical History:  Diagnosis Date   Depression    Hyperlipidemia    PCOS (polycystic ovarian syndrome)    Thyroid disease    hypothyroidism    Patient Active Problem List   Diagnosis Date Noted   Cellulitis 12/05/2016   PCOS (polycystic ovarian syndrome)    Hyperlipidemia    Thyroid disease    Severe obesity (BMI >= 40) (HCC) 10/30/2014   Cough variant asthma vs UACS vs combo of two 10/29/2014   Breast mass in female-right breast 6:00 position, 1.5 cm-benign. 06/15/2012    Past Surgical History:  Procedure Laterality Date   BREAST SURGERY     rt lumpectomy 2014   microdiskectomy       OB History   No obstetric history on file.      History reviewed. No pertinent family history.  Social History   Tobacco Use   Smoking status: Never Smoker   Smokeless tobacco: Never Used  Substance Use Topics   Alcohol use: No   Drug use: No    Home Medications Prior to Admission medications   Medication Sig Start Date End Date Taking? Authorizing Provider  acetaminophen (TYLENOL) 325 MG tablet Take 2 tablets (650 mg total) by mouth every 6 (six) hours as needed for mild pain (or Fever >/= 101). 12/15/16   Alison Murray, MD  Cholecalciferol (VITAMIN D PO) Take 1,000 mg by mouth 2 (two) times daily.     [provider]  escitalopram (LEXAPRO) 20 MG tablet Take 20 mg by mouth daily. 11/21/16   [provider]  fenofibrate 160 MG tablet TAKE 1 TABLET WITH A MEAL ONCE A DAY 10/03/14   [provider]  Inositol-Folic Acid (PREGNITUDE) 2000-200 MG-MCG PACK Take by mouth daily.    [provider]  levothyroxine (SYNTHROID, LEVOTHROID) 75 MCG tablet Take 75 mcg by mouth daily before breakfast.    [provider]  metFORMIN (GLUCOPHAGE) 1000 MG tablet Take 1,000 mg by mouth 2 (two) times daily with a meal.    [provider]    Allergies    Penicillin g, Amoxicillin, and Other  Review of Systems   Review of Systems  Gastrointestinal: Positive for abdominal pain.  All other systems  reviewed and are negative.   Physical Exam Updated Vital Signs BP 104/69    Pulse (!) 106    Temp 99 F (37.2 C) (Oral)    Resp 19    Ht 5\' 6"  (1.676 m)    Wt (!) 99.8 kg    SpO2 98%    BMI 35.51 kg/m   Physical Exam Vitals and nursing note reviewed.  Constitutional:      General: She is not in acute distress.    Appearance: She is well-developed. She is obese.  HENT:     Head: Atraumatic.  Eyes:     Conjunctiva/sclera: Conjunctivae normal.  Cardiovascular:     Rate and Rhythm: Normal rate and regular rhythm.  Pulmonary:     Effort: Pulmonary effort is normal.     Breath sounds:  Normal breath sounds.  Abdominal:     Tenderness: There is abdominal tenderness in the epigastric area and left upper quadrant. There is no right CVA tenderness, left CVA tenderness, guarding or rebound. Negative signs include Murphy's sign, Rovsing's sign, McBurney's sign and obturator sign.     Hernia: No hernia is present.  Musculoskeletal:     Cervical back: Neck supple.  Skin:    Findings: No rash.  Neurological:     Mental Status: She is alert and oriented to person, place, and time.  Psychiatric:        Mood and Affect: Mood normal.     ED Results / Procedures / Treatments   Labs (all labs ordered are listed, but only abnormal results are displayed) Labs Reviewed  COMPREHENSIVE METABOLIC PANEL - Abnormal; Notable for the following components:      Result Value   CO2 17 (*)    Glucose, Bld 101 (*)    Creatinine, Ser 0.40 (*)    Anion gap 16 (*)    All other components within normal limits  CBC - Abnormal; Notable for the following components:   WBC 10.9 (*)    RBC 5.12 (*)    All other components within normal limits  URINALYSIS, ROUTINE W REFLEX MICROSCOPIC - Abnormal; Notable for the following components:   Specific Gravity, Urine <1.005 (*)    Hgb urine dipstick MODERATE (*)    Leukocytes,Ua TRACE (*)    All other components within normal limits  SARS CORONAVIRUS 2 BY RT PCR (HOSPITAL ORDER, PERFORMED IN [facility])  LIPASE, BLOOD  URINALYSIS, MICROSCOPIC (REFLEX)  I-STAT BETA HCG BLOOD, ED ([facility], [facility], [facility] ONLY)    EKG None  Radiology Abdomen Limited  Result Date: 08/02/2019 CLINICAL DATA:  Right upper quadrant pain EXAM: ULTRASOUND ABDOMEN LIMITED RIGHT UPPER QUADRANT COMPARISON:  None. FINDINGS: Gallbladder: No gallstones or wall thickening visualized. No sonographic Murphy sign noted by sonographer. Common bile duct: Diameter: 4 mm Liver: Increased echotexture seen throughout. No focal abnormality or biliary ductal dilatation. Portal vein is  patent on color Doppler imaging with normal direction of blood flow towards the liver. Other: None. IMPRESSION: Normal appearing gallbladder. Hepatic steatosis Electronically Signed   By: 08/04/2019 M.D.   On: 08/02/2019 02:24    Procedures Procedures (including critical care time)  Medications Ordered in ED Medications  morphine 4 MG/ML injection 4 mg (4 mg Intravenous Given 08/02/19 0201)  ondansetron (ZOFRAN) injection 4 mg (4 mg Intravenous Given 08/02/19 0201)  sodium chloride 0.9 % bolus 1,000 mL (1,000 mLs Intravenous New Bag/Given 08/02/19 0201)    ED Course  I have reviewed the triage vital  signs and the nursing notes.  Pertinent labs & imaging results that were available during my care of the patient were reviewed by me and considered in my medical decision making (see chart for details).    MDM Rules/Calculators/A&P                          BP 124/67 (BP Location: Right Arm)    Pulse 87    Temp 99.8 F (37.7 C) (Oral)    Resp 18    Ht 5\' 6"  (1.676 m)    Wt (!) 99.8 kg    SpO2 97%    BMI 35.51 kg/m   Final Clinical Impression(s) / ED Diagnoses Final diagnoses:  Nausea vomiting and diarrhea    Rx / DC Orders ED Discharge Orders         Ordered    ondansetron (ZOFRAN) 4 MG tablet  Every 8 hours PRN     Discontinue  Reprint     08/02/19 0320         1:41 AM Patient report fever abdominal pain nausea vomiting and anorexia.  She also endorsed diarrhea.  On exam she does have some upper abdominal tenderness without guarding or rebound tenderness.  No tenderness to her left Smiles quadrant or right Mullings quadrant.  Will obtain limited abdominal ultrasound to rule out biliary disease.  Pain medication and antinausea medication along with IV fluid given.  3:22 AM Limited abdominal ultrasound show no evidence of gallbladder etiology.  Patient report feeling better after receiving symptomatic treatment.  She does voice concern for potential COVID-19 infection.  She has had a  vaccination.  Will obtain Covid test and patient can check the results through MyChart.  Otherwise she is stable for discharge.  Return precaution discussed.  Please note there is evidence of hemoglobin on urine dipsticks but no evidence of UTI.  Patient does not have any CVA tenderness suggestive of kidney stone.  She does have an IUD and admits to increased intermittent vaginal spotting.  She has no concern for STI.  Meghan Wallace was evaluated in Emergency Department on 08/02/2019 for the symptoms described in the history of present illness. She was evaluated in the context of the global COVID-19 pandemic, which necessitated consideration that the patient might be at risk for infection with the SARS-CoV-2 virus that causes COVID-19. Institutional protocols and algorithms that pertain to the evaluation of patients at risk for COVID-19 are in a state of rapid change based on information released by regulatory bodies including the CDC and federal and state organizations. These policies and algorithms were followed during the patient's care in the ED.    08/04/2019, PA-C 08/02/19 0323    [place], 08/04/19, MD 08/02/19 [phone]

## 2019-08-02 NOTE — Discharge Instructions (Signed)
Your symptoms may be due to a viral infection.  A COVID-19 test was obtained, please use MyChart, link below, to check for the results within the next 24 hours.  Take Zofran as needed for nausea.  Return if you have persistent vomiting or worsening of symptoms.

## 2021-09-25 IMAGING — US US ABDOMEN LIMITED
1 series · 14 of 25 positions shown · non-contrast
Comparison: None.

CLINICAL DATA: Right upper quadrant pain

EXAM:
ULTRASOUND ABDOMEN LIMITED RIGHT UPPER QUADRANT

[Series 1: us abdomen limited · 14 of 79 slices shown]
[im 1/79]
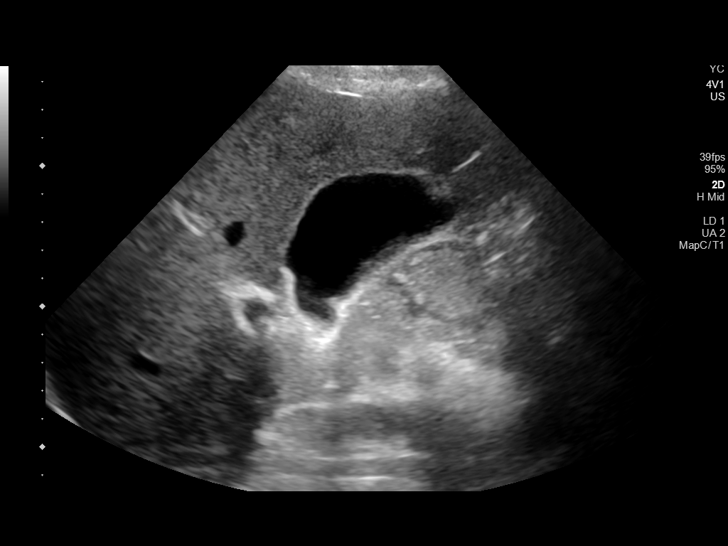
[im 7/79]
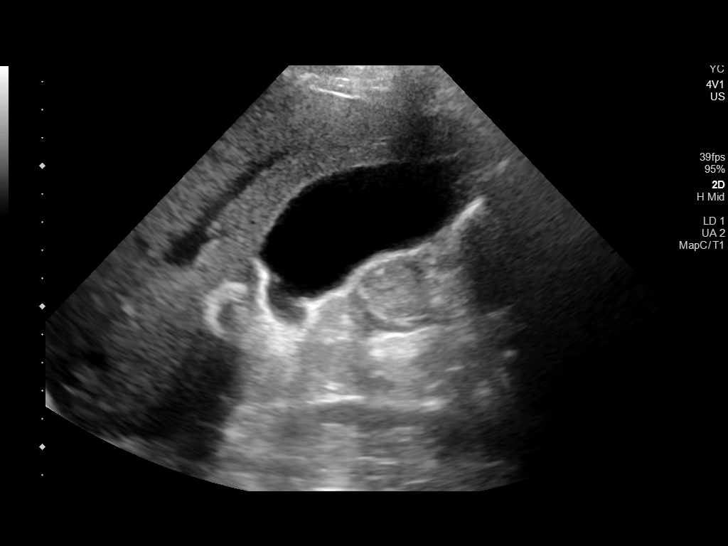
[im 14/79]
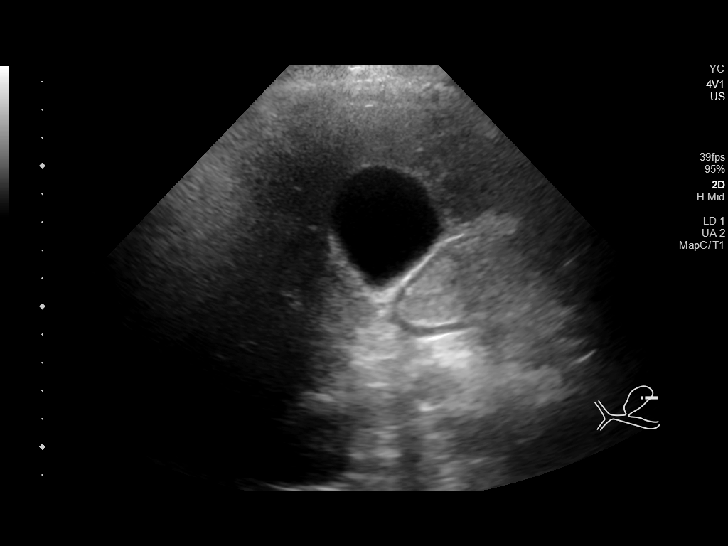
[im 20/79]
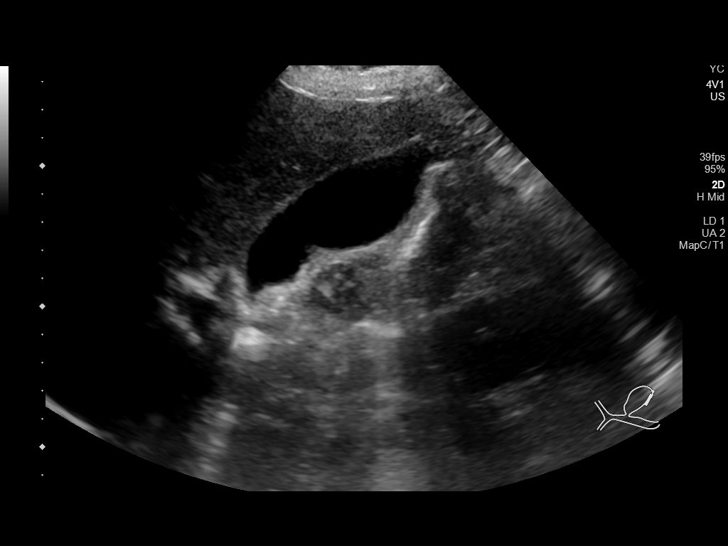
[im 27/79]
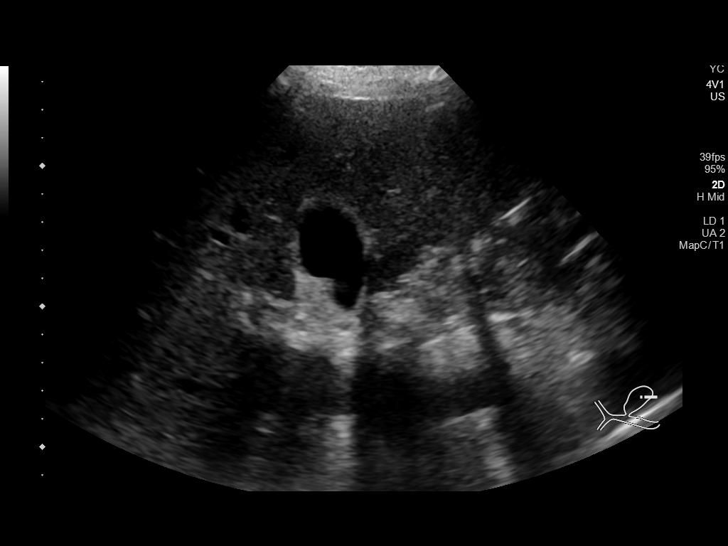
[im 30/79]
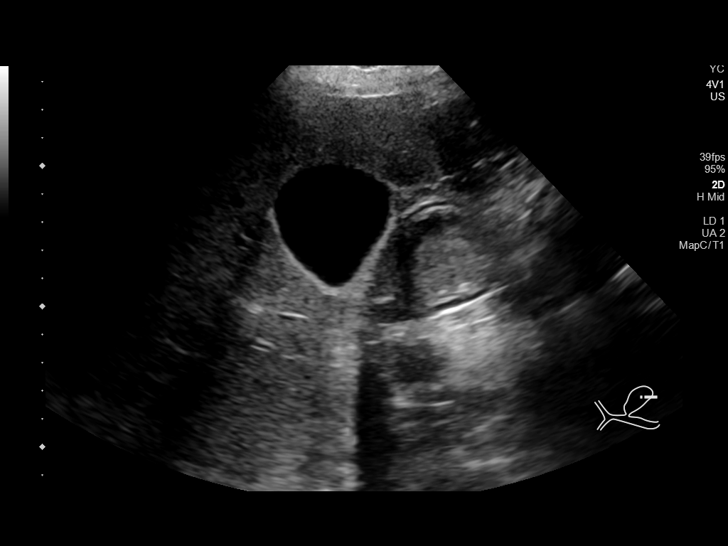
[im 36/79]
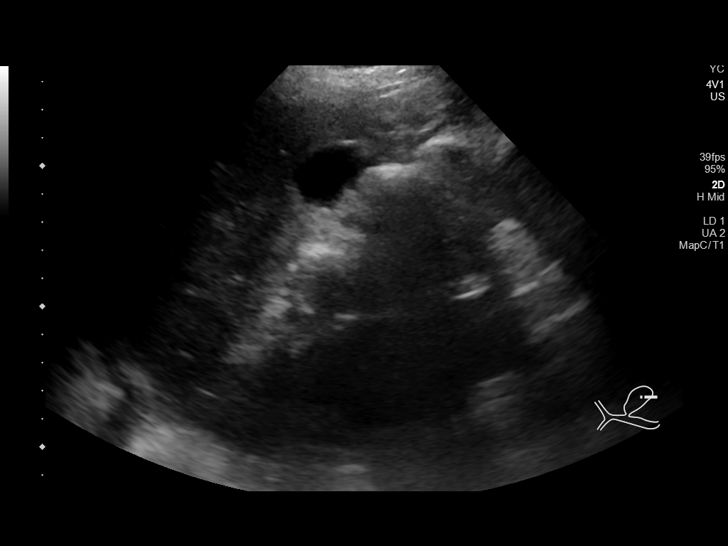
[im 43/79]
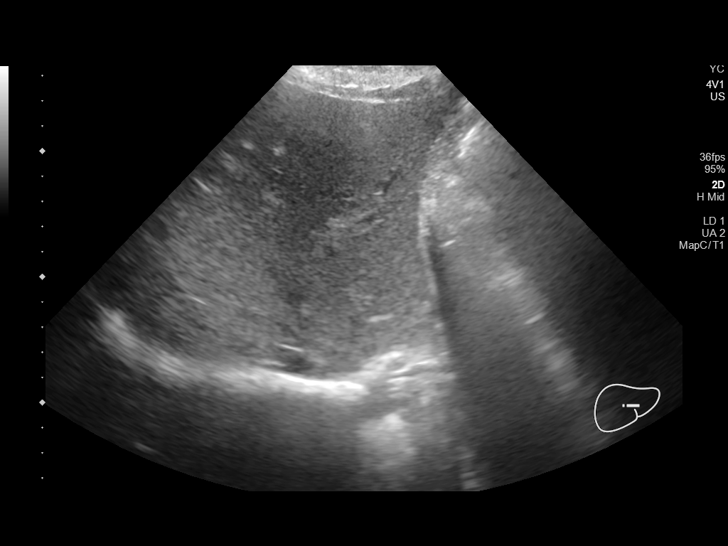
[im 49/79]
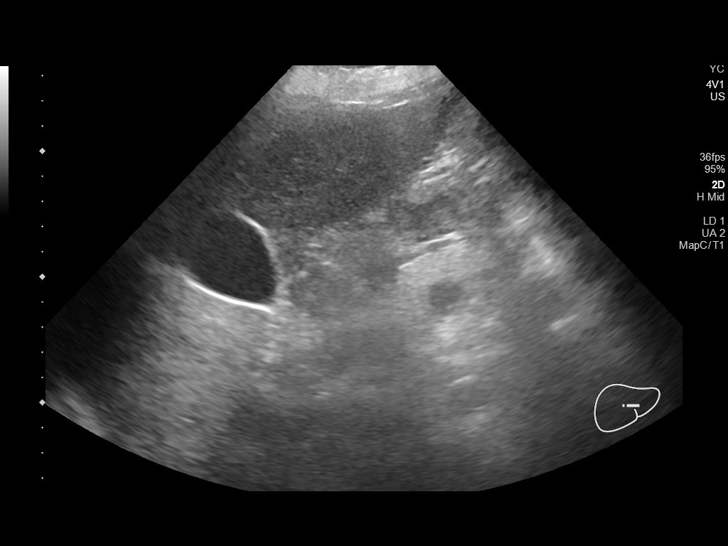
[im 53/79]
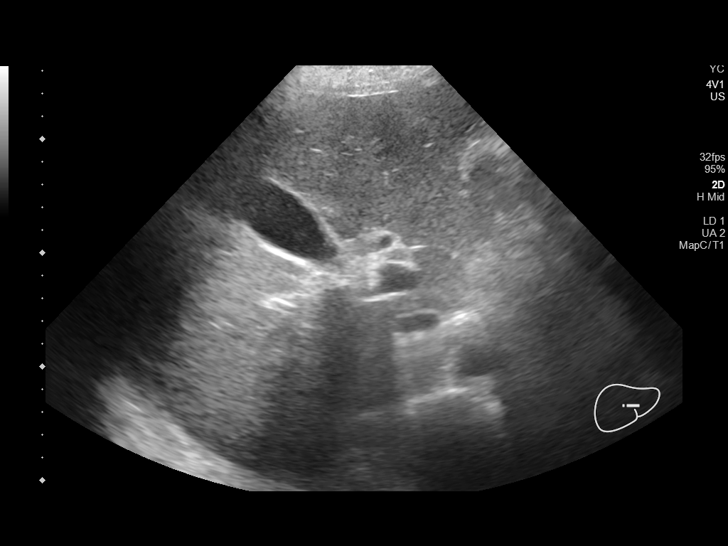
[im 59/79]
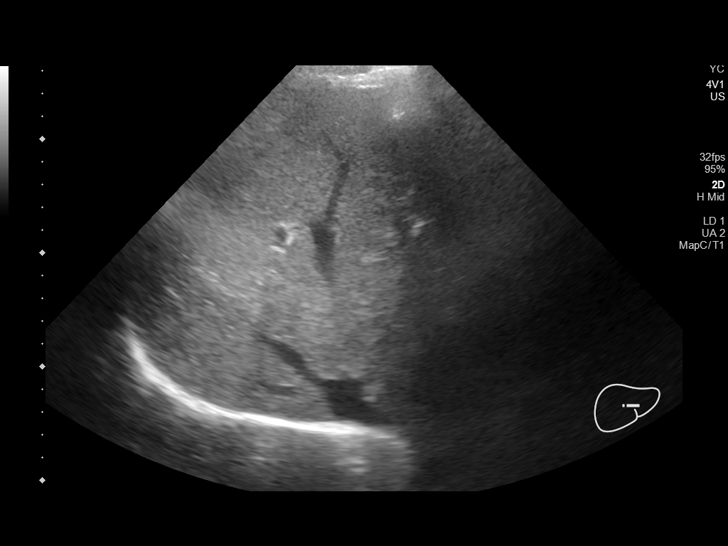
[im 66/79]
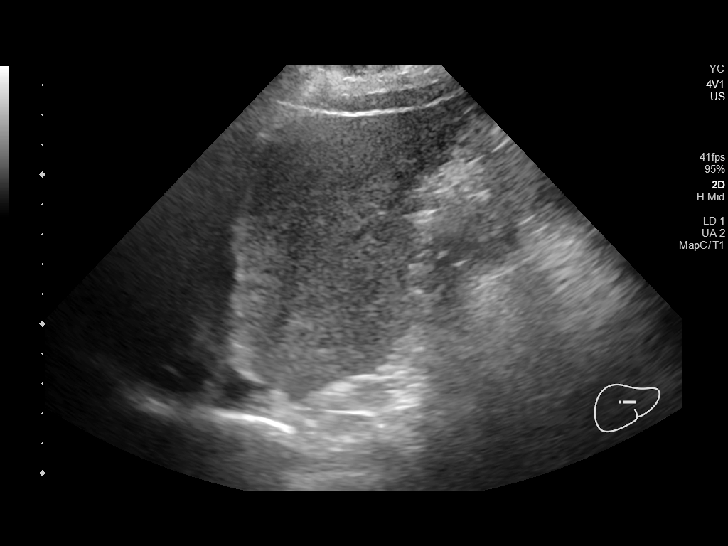
[im 72/79]
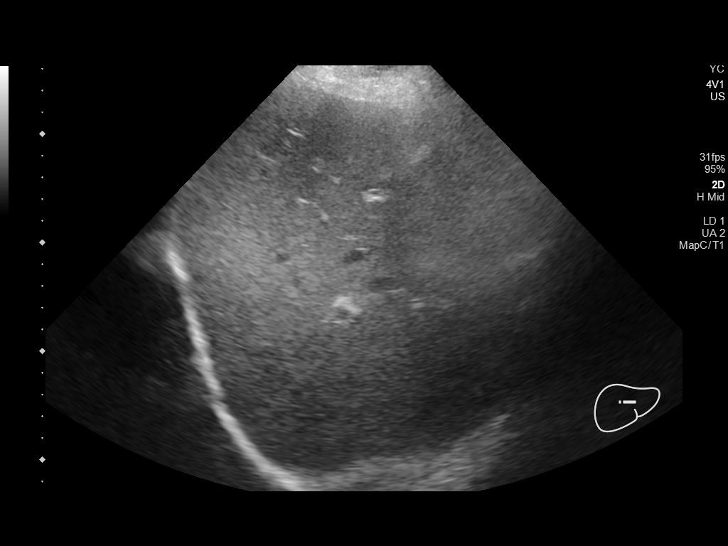
[im 79/79]
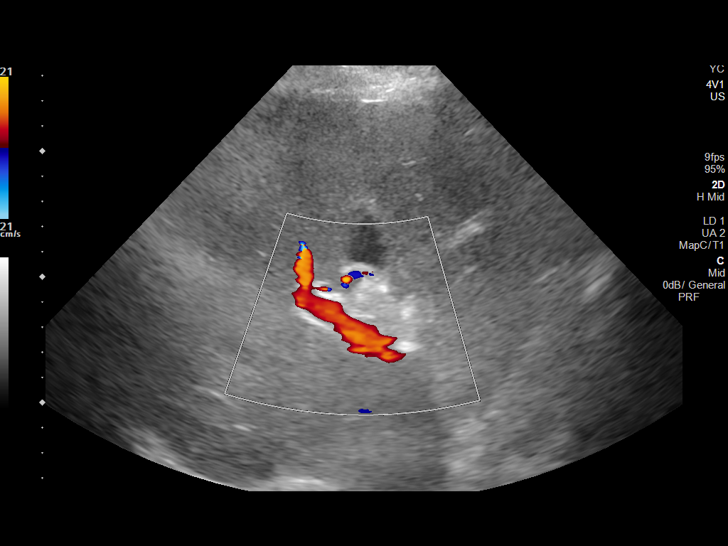

[14 of 25 positions shown; findings below may reference images not displayed]

FINDINGS: Gallbladder:

No gallstones or wall thickening visualized. No sonographic Murphy
sign noted by sonographer.

Common bile duct:

Diameter: 4 mm

Liver:

Increased echotexture seen throughout. No focal abnormality or
biliary ductal dilatation. Portal vein is patent on color Doppler
imaging with normal direction of blood flow towards the liver.

Other: None.
IMPRESSION: Normal appearing gallbladder.

Hepatic steatosis

## 2022-10-12 ENCOUNTER — Encounter (HOSPITAL_BASED_OUTPATIENT_CLINIC_OR_DEPARTMENT_OTHER): Payer: Self-pay

## 2023-01-17 NOTE — Progress Notes (Signed)
 Cardiology Office Note:  .   Date:  01/18/2023  ID:  [name], DOB [birth year], MRN [id number] PCP: Tamra Kay, MD  [facility] Providers Cardiologist:  Shelda Bruckner, MD {  History of Present Illness: .   Meghan Wallace is a 48 y.o. female with PMH prediabetes, high cholesterol, PCOS, obesity, NAFLD/NASH who is seen for exertional chest pain and shortness of breath.  Today: Referred 10/12/22 from Dr. Tamra for chest pain. Referral summary notes exertional chest pain and shortness of breath, with risk factors of prediabetes and elevated cholesterol. Note from 09/08/22 from Dr. Tamra reviewed. Reported several months of chest pressure, sometimes exertional, sometimes not. Lasts for several hours, resolves on its own. Also notes more shortness of breath with activity. Noted recent weight gain. Lipids from 03/02/22 show Tchol 167, TG 242, HDL 40, LDL 87.  Notes that her entire body has been hurting more, wondered if it was perimenopause. Noted intermittent chest pains, feels like she can't take a deep breath. Happens sometimes when she is resting as well. Not sharp, no focal, not squeezing. More like her chest is too heavy to get in a breath. Not every time she is active, but notices more when she pushes herself. [place], notes that she has to push herself hard up hills. Has been going on for about 6 months or so. Improves if she stops and rests.  -Alcohol: none (prior rare/social) -Tobacco: never -Family history:  father had MI around age 50 (also a smoker), pat Gpa died of MI. Mother and grandmother have high cholesterol.  Discussed metabolic syndrome. Has been told her cholesterol is abnormal her whole life. Has been diagnosed with PCOS, symptoms since adolescence. Started taking synthroid  in college. Now diagnosed with pre-diabetes, though has been on metformin  for a long time (~2005), used in prep for fertility. Conceived naturally for her son, though had tried with meds  prior. Has IUD now. Noted that she had elevated liver enzymes slightly for a few years, then had ultrasound done which showed enlarged liver. Then had fibroscan which showed fibrosis. Follows with [facility] annually for nonalcoholic fatty liver disease/NASH since 2020.   On atorvastatin 20 mg daily. Was on fenofibrate in the past.  Has remote history of anorexia, tries to avoid fixating on weight. Has not weighed recently. First diet was at age 7, has been on multiple agents since then. Has not been on GLP, discussed today.  ROS: Denies shortness of breath at rest. No PND, orthopnea, LE edema. No syncope or palpitations. ROS otherwise negative except as noted.   Studies Reviewed: SABRA    EKG:       Physical Exam:   VS:  BP 122/70   Pulse 74   Ht 5' 6 (1.676 m)   SpO2 94%   BMI 35.51 kg/m    Wt Readings from Last 3 Encounters:  08/01/19 (!) 220 lb (99.8 kg)  12/05/16 275 lb (124.7 kg)  11/13/14 257 lb 6.4 oz (116.8 kg)    GEN: Well nourished, well developed in no acute distress HEENT: Normal, moist mucous membranes NECK: No JVD CARDIAC: regular rhythm, normal S1 and S2, no rubs or gallops. No murmur. VASCULAR: Radial and DP pulses 2+ bilaterally. No carotid bruits RESPIRATORY:  Clear to auscultation without rales, wheezing or rhonchi  ABDOMEN: Soft, non-tender, non-distended MUSCULOSKELETAL:  Ambulates independently SKIN: Warm and dry, no edema NEUROLOGIC:  Alert and oriented x 3. No focal neuro deficits noted. PSYCHIATRIC:  Normal affect  ASSESSMENT AND PLAN: .    Precordial pain Dyspnea on exertion -discussed treadmill stress, nuclear stress/lexiscan, and CT coronary angiography. Discussed pros and cons of each, including but not limited to false positive/false negative risk, radiation risk, and risk of IV contrast dye. Based on shared decision making, decision was made to pursue exercise stress test first, then CT (if BMI permits) if abnormal -reviewed red flag warning signs  that need immediate medical attention   Informed Consent   Shared Decision Making/Informed Consent The risks [chest pain, shortness of breath, cardiac arrhythmias, dizziness, blood pressure fluctuations, myocardial infarction, stroke/transient ischemic attack, and life-threatening complications (estimated to be 1 in 10,000)], benefits (risk stratification, diagnosing coronary artery disease, treatment guidance) and alternatives of an exercise tolerance test were discussed in detail with Ms. Ashland and she agrees to proceed.      Metabolic syndrome, with  PCOS Prediabetes Mixed hyperlipidemia NAFLD/NASH Obesity Abdominal adiposity -discussed metabolic syndrome at length today. Discussed risk, how these are related, recommendations for management. Once treadmill results return, would be a good candidate for GLP if covered and she is interested.  CV risk counseling and prevention -recommend heart healthy/Mediterranean diet, with whole grains, fruits, vegetable, fish, lean meats, nuts, and olive oil. Limit salt. -recommend moderate walking, 3-5 times/week for 30-50 minutes each session. Aim for at least 150 minutes.week. Goal should be pace of 3 miles/hours, or walking 1.5 miles in 30 minutes -recommend avoidance of tobacco products. Avoid excess alcohol.  Dispo: 6 weeks or sooner as needed  Signed, Shelda Bruckner, MD   Shelda Bruckner, MD, PhD, [facility]  [facility]  [place]  [facility] at [facility] at [facility] [address], [address] [place], [place] [address] [phone]

## 2023-01-18 ENCOUNTER — Ambulatory Visit (HOSPITAL_BASED_OUTPATIENT_CLINIC_OR_DEPARTMENT_OTHER): Payer: 59 | Admitting: Cardiology

## 2023-01-18 ENCOUNTER — Encounter (HOSPITAL_BASED_OUTPATIENT_CLINIC_OR_DEPARTMENT_OTHER): Payer: Self-pay | Admitting: Cardiology

## 2023-01-18 VITALS — BP 122/70 | HR 74 | Ht 66.0 in | Wt 302.6 lb

## 2023-01-18 DIAGNOSIS — E782 Mixed hyperlipidemia: Secondary | ICD-10-CM

## 2023-01-18 DIAGNOSIS — E8881 Metabolic syndrome: Secondary | ICD-10-CM | POA: Diagnosis not present

## 2023-01-18 DIAGNOSIS — R0609 Other forms of dyspnea: Secondary | ICD-10-CM

## 2023-01-18 DIAGNOSIS — R7303 Prediabetes: Secondary | ICD-10-CM

## 2023-01-18 DIAGNOSIS — R072 Precordial pain: Secondary | ICD-10-CM

## 2023-01-18 DIAGNOSIS — E282 Polycystic ovarian syndrome: Secondary | ICD-10-CM | POA: Diagnosis not present

## 2023-01-18 DIAGNOSIS — Z7189 Other specified counseling: Secondary | ICD-10-CM

## 2023-01-18 DIAGNOSIS — Z8249 Family history of ischemic heart disease and other diseases of the circulatory system: Secondary | ICD-10-CM

## 2023-01-18 NOTE — Patient Instructions (Addendum)
 Medication Instructions:  Your physician recommends that you continue on your current medications as directed. Please refer to the Current Medication list given to you today.  *If you need a refill on your cardiac medications before your next appointment, please call your pharmacy*   Testing/Procedures: Your physician has requested that you have en exercise stress myoview. For further information please visit [email or web address] Please follow instruction sheet, as given.   Follow-Up: At [facility], you and your health needs are our priority.  As part of our continuing mission to provide you with exceptional heart care, we have created designated Provider Care Teams.  These Care Teams include your primary Cardiologist (physician) and Advanced Practice Providers (APPs -  Physician Assistants and Nurse Practitioners) who all work together to provide you with the care you need, when you need it.  We recommend signing up for the patient portal called MyChart.  Sign up information is provided on this After Visit Summary.  MyChart is used to connect with patients for Virtual Visits (Telemedicine).  Patients are able to view lab/test results, encounter notes, upcoming appointments, etc.  Non-urgent messages can be sent to your provider as well.   To learn more about what you can do with MyChart, go to [email or web address].    Your next appointment:   6 week(s)  Provider:   Shelda Bruckner, MD    Other Instructions      .ins

## 2023-01-30 ENCOUNTER — Encounter (HOSPITAL_BASED_OUTPATIENT_CLINIC_OR_DEPARTMENT_OTHER): Payer: Self-pay | Admitting: Cardiology

## 2023-02-02 ENCOUNTER — Ambulatory Visit: Payer: 59 | Attending: Cardiology

## 2023-02-02 DIAGNOSIS — R072 Precordial pain: Secondary | ICD-10-CM

## 2023-02-02 LAB — EXERCISE TOLERANCE TEST
Angina Index: 0
Base ST Depression (mm): 0 mm
Duke Treadmill Score: 5
Estimated workload: 7
Exercise duration (min): 5 min
Exercise duration (sec): 4 s
MPHR: 174 {beats}/min
Peak HR: 157 {beats}/min
Percent HR: 90 %
RPE: 18
Rest HR: 74 {beats}/min
ST Depression (mm): 0 mm

## 2023-02-19 ENCOUNTER — Encounter (HOSPITAL_BASED_OUTPATIENT_CLINIC_OR_DEPARTMENT_OTHER): Payer: Self-pay

## 2023-02-19 NOTE — Telephone Encounter (Signed)
-----   Message from [facility] sent at 02/19/2023 10:52 AM EST ----- Stress test overall did not show any evidence of blockage, which supports that the chest pain is not coming from the heart. Your blood pressure did go very high with exercise--this may be due to the intensity of the exercise. It is safe to exercise, but I would focus on gradually increasing moderate intensity exercise. We can talk more about this at your visit.

## 2023-02-20 ENCOUNTER — Encounter (HOSPITAL_COMMUNITY): Payer: Self-pay

## 2023-02-20 ENCOUNTER — Emergency Department (HOSPITAL_COMMUNITY)
Admission: EM | Admit: 2023-02-20 | Discharge: 2023-02-20 | Disposition: A | Discharge status: Home / Self Care | Payer: 59 | Attending: Emergency Medicine | Admitting: Emergency Medicine

## 2023-02-20 ENCOUNTER — Emergency Department (HOSPITAL_COMMUNITY): Payer: 59

## 2023-02-20 DIAGNOSIS — R109 Unspecified abdominal pain: Secondary | ICD-10-CM | POA: Diagnosis present

## 2023-02-20 DIAGNOSIS — N132 Hydronephrosis with renal and ureteral calculous obstruction: Secondary | ICD-10-CM | POA: Insufficient documentation

## 2023-02-20 DIAGNOSIS — N390 Urinary tract infection, site not specified: Secondary | ICD-10-CM | POA: Diagnosis not present

## 2023-02-20 DIAGNOSIS — R319 Hematuria, unspecified: Secondary | ICD-10-CM

## 2023-02-20 DIAGNOSIS — B9689 Other specified bacterial agents as the cause of diseases classified elsewhere: Secondary | ICD-10-CM | POA: Diagnosis not present

## 2023-02-20 LAB — URINALYSIS, ROUTINE W REFLEX MICROSCOPIC
Bilirubin Urine: NEGATIVE
Glucose, UA: NEGATIVE mg/dL
Ketones, ur: NEGATIVE mg/dL
Nitrite: NEGATIVE
Protein, ur: 100 mg/dL — AB
Specific Gravity, Urine: 1.02 (ref 1.005–1.030)
pH: 5 (ref 5.0–8.0)

## 2023-02-20 LAB — CBC
HCT: 42.4 % (ref 36.0–46.0)
Hemoglobin: 13.9 g/dL (ref 12.0–15.0)
MCH: 28.6 pg (ref 26.0–34.0)
MCHC: 32.8 g/dL (ref 30.0–36.0)
MCV: 87.2 fL (ref 80.0–100.0)
Platelets: 463 10*3/uL — ABNORMAL HIGH (ref 150–400)
RBC: 4.86 MIL/uL (ref 3.87–5.11)
RDW: 13 % (ref 11.5–15.5)
WBC: 12.7 10*3/uL — ABNORMAL HIGH (ref 4.0–10.5)
nRBC: 0 % (ref 0.0–0.2)

## 2023-02-20 LAB — COMPREHENSIVE METABOLIC PANEL
ALT: 29 U/L (ref 0–44)
AST: 28 U/L (ref 15–41)
Albumin: 3.8 g/dL (ref 3.5–5.0)
Alkaline Phosphatase: 60 U/L (ref 38–126)
Anion gap: 13 (ref 5–15)
BUN: 13 mg/dL (ref 6–20)
CO2: 25 mmol/L (ref 22–32)
Calcium: 10.3 mg/dL (ref 8.9–10.3)
Chloride: 101 mmol/L (ref 98–111)
Creatinine, Ser: 0.69 mg/dL (ref 0.44–1.00)
GFR, Estimated: >60 mL/min (ref >60)
Glucose, Bld: 107 mg/dL — ABNORMAL HIGH (ref 70–99)
Potassium: 4.1 mmol/L (ref 3.5–5.1)
Sodium: 139 mmol/L (ref 135–145)
Total Bilirubin: 0.8 mg/dL (ref 0.0–1.2)
Total Protein: 7.4 g/dL (ref 6.5–8.1)

## 2023-02-20 LAB — HCG, SERUM, QUALITATIVE: Preg, Serum: NEGATIVE

## 2023-02-20 LAB — LIPASE, BLOOD: Lipase: 39 U/L (ref 11–51)

## 2023-02-20 MED ORDER — IOHEXOL 350 MG/ML SOLN
75.0000 mL | Freq: Once | INTRAVENOUS | Status: AC | PRN
Start: 2023-02-20 — End: 2023-02-20
  Administered 2023-02-20: 75 mL via INTRAVENOUS

## 2023-02-20 MED ORDER — SODIUM CHLORIDE 0.9 % IV BOLUS
1000.0000 mL | Freq: Once | INTRAVENOUS | Status: AC
  Administered 2023-02-20: 1000 mL via INTRAVENOUS

## 2023-02-20 MED ORDER — CEPHALEXIN 250 MG PO CAPS
500.0000 mg | ORAL_CAPSULE | Freq: Once | ORAL | Status: AC
  Administered 2023-02-20: 500 mg via ORAL
  Filled 2023-02-20: qty 2

## 2023-02-20 MED ORDER — ONDANSETRON 4 MG PO TBDP: 4.0000 mg | ORAL_TABLET | Freq: Three times a day (TID) | ORAL | 0 refills | Status: DC | PRN

## 2023-02-20 MED ORDER — OXYCODONE-ACETAMINOPHEN 5-325 MG PO TABS: 1.0000 | ORAL_TABLET | Freq: Four times a day (QID) | ORAL | 0 refills | Status: DC | PRN

## 2023-02-20 MED ORDER — OXYCODONE-ACETAMINOPHEN 5-325 MG PO TABS
1.0000 | ORAL_TABLET | Freq: Once | ORAL | Status: AC
  Administered 2023-02-20: 1 via ORAL
  Filled 2023-02-20: qty 1

## 2023-02-20 MED ORDER — KETOROLAC TROMETHAMINE 15 MG/ML IJ SOLN
15.0000 mg | Freq: Once | INTRAMUSCULAR | Status: AC
  Administered 2023-02-20: 15 mg via INTRAVENOUS
  Filled 2023-02-20: qty 1

## 2023-02-20 MED ORDER — ONDANSETRON 4 MG PO TBDP
4.0000 mg | ORAL_TABLET | Freq: Once | ORAL | Status: AC
  Administered 2023-02-20: 4 mg via ORAL
  Filled 2023-02-20: qty 1

## 2023-02-20 MED ORDER — CEPHALEXIN 500 MG PO CAPS: 500.0000 mg | ORAL_CAPSULE | Freq: Four times a day (QID) | ORAL | 0 refills | Status: AC

## 2023-02-20 MED ORDER — TAMSULOSIN HCL 0.4 MG PO CAPS: 0.4000 mg | ORAL_CAPSULE | Freq: Every day | ORAL | 0 refills | Status: DC

## 2023-02-20 MED ORDER — FLUCONAZOLE 150 MG PO TABS: 150.0000 mg | ORAL_TABLET | Freq: Once | ORAL | 0 refills | Status: AC

## 2023-02-20 NOTE — ED Provider Notes (Signed)
[place] EMERGENCY DEPARTMENT AT [facility] Provider Note   CSN: [id number] Arrival date & time: 02/20/23  0104     History  Chief Complaint  Patient presents with   Abdominal Pain    Meghan Wallace is a 48 y.o. female.  Patient is a 48 year old female with a past medical history of hyperlipidemia and thyroid disease who presents to the Emergency Department with a chief complaint of urinary frequency, dysuria, left flank pain which has been ongoing for approximate the past 4 days.  Patient notes that she was evaluated at an urgent care approximately 4 days ago secondary to her symptoms and notes that she was diagnosed with urinary tract infection at that time.  Patient was initially placed on Macrobid and then the antibiotic was changed to azithromycin as her urine culture grew group B strep.  Patient notes that she is continue to have ongoing intermittent pain since that time.  She notes that she was initially scheduled to have an outpatient CT scan but notes that she has not had this done as of yet.  Patient denies any associated fever, chills, chest pain, shortness of breath.  She does admit to associated nausea and vomiting.  There is been no associated diarrhea.  She denies any associated dizziness, lightheadedness, syncope.  She has had no recent falls or blunt abdominal wall or back trauma.   Abdominal Pain Associated symptoms: dysuria        Home Medications Prior to Admission medications   Medication Sig Start Date End Date Taking? Authorizing Provider  Cholecalciferol (VITAMIN D PO) Take 1,000 mg by mouth 2 (two) times daily.     [provider]  escitalopram (LEXAPRO) 20 MG tablet Take 20 mg by mouth daily. 11/21/16   [provider]  Inositol-Folic Acid (PREGNITUDE) 2000-200 MG-MCG PACK Take by mouth daily.    [provider]  levothyroxine (SYNTHROID, LEVOTHROID) 75 MCG tablet Take 75 mcg by mouth daily before breakfast.    [provider]  metFORMIN (GLUCOPHAGE) 1000 MG tablet Take 1,000 mg by mouth 2 (two) times daily with a meal.    [provider]      Allergies    Penicillin g, Amoxicillin, and Other    Review of Systems   Review of Systems  Gastrointestinal:  Positive for abdominal pain.  Genitourinary:  Positive for dysuria and frequency.  All other systems reviewed and are negative.   Physical Exam Updated Vital Signs BP (!) 104/51 (BP Location: Right Arm)   Pulse 69   Temp 97.8 F (36.6 C)   Resp 16   SpO2 97%  Physical Exam Vitals reviewed.  Constitutional:      Appearance: Normal appearance.  HENT:     Head: Normocephalic and atraumatic.     Nose: Nose normal.     Mouth/Throat:     Mouth: Mucous membranes are moist.  Eyes:     Extraocular Movements: Extraocular movements intact.     Conjunctiva/sclera: Conjunctivae normal.     Pupils: Pupils are equal, round, and reactive to light.  Cardiovascular:     Rate and Rhythm: Normal rate and regular rhythm.     Pulses: Normal pulses.     Heart sounds: Normal heart sounds.  Pulmonary:     Effort: Pulmonary effort is normal.     Breath sounds: Normal breath sounds.  Abdominal:     General: Abdomen is flat. Bowel sounds are normal. There is no distension. There are no signs of  injury.     Palpations: Abdomen is soft.     Tenderness: There is abdominal tenderness. There is left CVA tenderness. There is no right CVA tenderness, guarding or rebound. Negative signs include Murphy's sign and McBurney's sign.  Musculoskeletal:        General: Normal range of motion.     Cervical back: Normal range of motion and neck supple.  Skin:    General: Skin is warm and dry.     Findings: No rash.  Neurological:     General: No focal deficit present.     Mental Status: She is alert and oriented to person, place, and time. Mental status is at baseline.  Psychiatric:        Mood and Affect: Mood normal.        Behavior: Behavior normal.         Thought Content: Thought content normal.        Judgment: Judgment normal.     ED Results / Procedures / Treatments   Labs (all labs ordered are listed, but only abnormal results are displayed) Labs Reviewed  COMPREHENSIVE METABOLIC PANEL - Abnormal; Notable for the following components:      Result Value   Glucose, Bld 107 (*)    All other components within normal limits  CBC - Abnormal; Notable for the following components:   WBC 12.7 (*)    Platelets 463 (*)    All other components within normal limits  URINALYSIS, ROUTINE W REFLEX MICROSCOPIC - Abnormal; Notable for the following components:   APPearance CLOUDY (*)    Hgb urine dipstick MODERATE (*)    Protein, ur 100 (*)    Leukocytes,Ua MODERATE (*)    Bacteria, UA FEW (*)    All other components within normal limits  URINE CULTURE  LIPASE, BLOOD  HCG, SERUM, QUALITATIVE    EKG None  Radiology No results found.  Procedures Procedures    Medications Ordered in ED Medications  ketorolac (TORADOL) 15 MG/ML injection 15 mg (has no administration in time range)  sodium chloride 0.9 % bolus 1,000 mL (has no administration in time range)  ondansetron (ZOFRAN-ODT) disintegrating tablet 4 mg (4 mg Oral Given 02/20/23 0111)  oxyCODONE-acetaminophen (PERCOCET/ROXICET) 5-325 MG per tablet 1 tablet (1 tablet Oral Given 02/20/23 0111)    ED Course/ Medical Decision Making/ A&P                                 Medical Decision Making This patient presents to the ED for concern of left flank pain differential diagnosis includes pyelonephritis, kidney stone, mesenteric ischemia, diverticulitis, small bowel obstruction, pancreatitis    Additional history obtained:  Additional history obtained from medical records External records from outside source obtained and reviewed including urinalysis, urine culture   Lab Tests:  I Ordered, and personally interpreted labs.  The pertinent results include: Urinalysis  consistent with urinary tract infection  Imaging Studies ordered:  I ordered imaging studies including left study ureteral stone I independently visualized and interpreted imaging which showed left-sided ureteral stone with mild hydronephrosis I agree with the radiologist interpretation   Medicines ordered and prescription drug management:  I ordered medication including Keflex for urinary tract infection Reevaluation of the patient after these medicines showed that the patient improved I have reviewed the patients home medicines and have made adjustments as needed   Problem List / ED Course:  Patient is doing well  at this time and is stable for discharge home.  Discussed with patient that CT scan of the abdomen and pelvis is consistent with a left-sided ureteral stone.  Urinalysis is consistent with urinary tract infection.  She only has a mild leukocytosis at this point no indication for acute kidney injury.  Patient does have stable vital signs with no indication for sepsis and do not suspect that admission is warranted at this time.  Will discharge patient home continuing outpatient antibiotics.  She was given a dose of Keflex in the emergency department and was able to tolerate this without any signs of an allergic reaction.  Patient has no associated electrolyte changes.  The need for close follow-up with urology on outpatient basis was discussed as well as strict term cautions for any new or worsening symptoms.  Patient voiced understanding had no additional questions.   Social Determinants of Health:  None       Amount and/or Complexity of Data Reviewed Labs: ordered. Radiology: ordered.  Risk Prescription drug management.           Final Clinical Impression(s) / ED Diagnoses Final diagnoses:  None    Rx / DC Orders ED Discharge Orders     None         Lelon Perla, PA-C 02/20/23 1141    Eber Hong, MD 02/21/23 [id number]

## 2023-02-20 NOTE — Discharge Instructions (Signed)
Please take all antibiotics as directed.  Follow-up closely with urology on outpatient basis and please call to make this appointment.  Return to emergency department immediately for any new or worsening symptoms.

## 2023-02-20 NOTE — ED Triage Notes (Signed)
Pt states that she is having L sided abd pain with nausea and vomiting for the past few days. Went to UC and was started on antibiotics for UTI. Pain continues.

## 2023-02-21 LAB — URINE CULTURE: Culture: 50000 — AB

## 2023-02-22 ENCOUNTER — Telehealth (HOSPITAL_BASED_OUTPATIENT_CLINIC_OR_DEPARTMENT_OTHER): Payer: Self-pay | Admitting: *Deleted

## 2023-02-22 NOTE — Telephone Encounter (Signed)
Post ED Visit - Positive Culture Follow-up  Culture report reviewed by antimicrobial stewardship pharmacist: [facility] [x]  Ivery Quale, [place].D. []  Celedonio Miyamoto, Pharm.D., BCPS AQ-ID []  Garvin Fila, Pharm.D., BCPS []  Georgina Pillion, [address].D., BCPS []  [place], [address].D., BCPS, AAHIVP []  Estella Husk, Pharm.D., BCPS, AAHIVP []  Lysle Pearl, PharmD, BCPS []  Phillips Climes, PharmD, BCPS []  Agapito Games, PharmD, BCPS []  Verlan Friends, PharmD []  Mervyn Gay, PharmD, BCPS []  Vinnie Level, PharmD  [facility] []  Len Childs, PharmD []  Greer Pickerel, PharmD []  Adalberto Cole, PharmD []  Perlie Gold, Rph []  Lonell Face) Jean Rosenthal, PharmD []  Earl Many, PharmD []  Junita Push, PharmD []  Dorna Leitz, PharmD []  Terrilee Files, PharmD []  Lynann Beaver, PharmD []  Keturah Barre, PharmD []  Loralee Pacas, PharmD []  Bernadene Person, PharmD   Positive urine culture Treated with Cephalexin and Fluconazole , organism sensitive to the same and no further patient follow-up is required at this time.  Bing Quarry 02/22/2023, 9:46 AM

## 2023-03-01 ENCOUNTER — Ambulatory Visit (HOSPITAL_BASED_OUTPATIENT_CLINIC_OR_DEPARTMENT_OTHER): Payer: 59 | Admitting: Cardiology

## 2023-03-01 ENCOUNTER — Encounter (HOSPITAL_BASED_OUTPATIENT_CLINIC_OR_DEPARTMENT_OTHER): Payer: Self-pay | Admitting: Cardiology

## 2023-03-01 VITALS — BP 118/82 | HR 70 | Ht 66.0 in | Wt 304.9 lb

## 2023-03-01 DIAGNOSIS — E782 Mixed hyperlipidemia: Secondary | ICD-10-CM | POA: Diagnosis not present

## 2023-03-01 DIAGNOSIS — E282 Polycystic ovarian syndrome: Secondary | ICD-10-CM | POA: Diagnosis not present

## 2023-03-01 DIAGNOSIS — E8881 Metabolic syndrome: Secondary | ICD-10-CM | POA: Diagnosis not present

## 2023-03-01 DIAGNOSIS — Z8249 Family history of ischemic heart disease and other diseases of the circulatory system: Secondary | ICD-10-CM | POA: Diagnosis not present

## 2023-03-01 DIAGNOSIS — Z712 Person consulting for explanation of examination or test findings: Secondary | ICD-10-CM

## 2023-03-01 DIAGNOSIS — R7303 Prediabetes: Secondary | ICD-10-CM

## 2023-03-01 NOTE — Progress Notes (Signed)
 Cardiology Office Note:  .   Date:  03/01/2023  ID:  Meghan Wallace, DOB 10/18/1975, MRN [id number] PCP: Jeannie Done, MD  [facility] Providers Cardiologist:  Jodelle Red, MD {  History of Present Illness: .   Meghan Wallace is a 48 y.o. female with PMH prediabetes, high cholesterol, PCOS, obesity, NAFLD/NASH who is seen for follow up. I met her as a new consult in 01/2023 for exertional chest pain and shortness of breath.  CV history: -Lipids from 03/02/22 show Tchol 167, TG 242, HDL 40, LDL 87. -Family history:  father had MI around age 47 (also a smoker), pat Gpa died of MI. Mother and grandmother have high cholesterol.  Likely metabolic syndrome. Has been told her cholesterol is abnormal her whole life. Has been diagnosed with PCOS, symptoms since adolescence. Started taking synthroid in college. Now diagnosed with pre-diabetes, though has been on metformin for a long time (~2005), used in prep for fertility. Conceived naturally for her son, though had tried with meds prior. Has IUD now. Noted that she had elevated liver enzymes slightly for a few years, then had ultrasound done which showed enlarged liver. Then had fibroscan which showed fibrosis. Follows with [facility] annually for nonalcoholic fatty liver disease/NASH since 2020.   On atorvastatin 20 mg daily. Was on fenofibrate in the past.  Today: Since last visit, has had the flu and UTI with a kidney stone. Was treated with three antibiotics. Has never had kidney stones before.  We reviewed her ETT results today. No evidence of ischemia. Did have hypertensive response to exercise, discussed today.   Does still have intermittent chest twinges, feels dull  ROS: Denies shortness of breath. No PND, orthopnea, LE edema or unexpected weight gain. No syncope or palpitations. ROS otherwise negative except as noted.   Studies Reviewed: Marland Kitchen    EKG:       Physical Exam:   VS:  BP 118/82   Pulse 70   Ht 5\' 6"  (1.676 m)   Wt  (!) 304 lb 14.4 oz (138.3 kg)   SpO2 97%   BMI 49.21 kg/m    Wt Readings from Last 3 Encounters:  03/01/23 (!) 304 lb 14.4 oz (138.3 kg)  01/18/23 (!) 302 lb 9.6 oz (137.3 kg)  08/01/19 (!) 220 lb (99.8 kg)    GEN: Well nourished, well developed in no acute distress HEENT: Normal, moist mucous membranes NECK: No JVD CARDIAC: regular rhythm, normal S1 and S2, no rubs or gallops. No murmur. VASCULAR: Radial and DP pulses 2+ bilaterally. No carotid bruits RESPIRATORY:  Clear to auscultation without rales, wheezing or rhonchi  ABDOMEN: Soft, non-tender, non-distended MUSCULOSKELETAL:  Ambulates independently SKIN: Warm and dry, no edema NEUROLOGIC:  Alert and oriented x 3. No focal neuro deficits noted. PSYCHIATRIC:  Normal affect    ASSESSMENT AND PLAN: .    Precordial pain Dyspnea on exertion -reviewed results of treadmill stress test. No ischemia. Did have hypertensive response -discussed aiming for moderate intensity exercise. She is currently swimming, walking, doing gentle yoga, encouraged her to continue this -discussed addition of weight training/resistance to preserve/build muscle mass long term -reviewed red flag warning signs that need immediate medical attention   Metabolic syndrome, with  PCOS Prediabetes Mixed hyperlipidemia NAFLD/NASH Obesity Abdominal adiposity -we have discussed metabolic syndrome at length, see prior notes -continue atorvastatin for primary prevention -would be a good candidate for GLP if covered and she is interested. Focus is on changing metabolic pattern, not on weight loss  -  Patient has a BMI of over 30 or over 27 with a weight related comorbidity -Current BMI 49.21 -Starting weight 304 lbs -Weight related comorbidities: metabolic syndrome, PCOS, prediabetes, NAFLD -Has attempted weight loss with diet/exercise changes. Has tried metformin and multiple diet programs in the past. She is routinely physically active. Has not been able to  lose >5% of weight on her own -Patient is medically appropriate for GLP treatment for weight loss -discussed potential side effects, including GI discomfort (nausea, constipation, diarrhea). Discussed data re: pancreatitis. No known history of multiple endocrine neoplasia. -would stop metformin with start of GLP as she has had GI side effects on metformin, and we want to minimize this.  CV risk counseling and prevention -recommend heart healthy/Mediterranean diet, with whole grains, fruits, vegetable, fish, lean meats, nuts, and olive oil. Limit salt. -recommend moderate walking, 3-5 times/week for 30-50 minutes each session. Aim for at least 150 minutes.week. Goal should be pace of 3 miles/hours, or walking 1.5 miles in 30 minutes -recommend avoidance of tobacco products. Avoid excess alcohol.  Total time of encounter: I spent 47 minutes dedicated to the care of this patient on the date of this encounter to include pre-visit review of records, face-to-face time with the patient discussing conditions above, and clinical documentation with the electronic health record. We specifically spent time today discussing reviewing her test results, discussing metabolic syndrome, discussing GLP1, reviewing lifestyle recommendations   Dispo: 3-4 months  Signed, Jodelle Red, MD   Jodelle Red, MD, PhD, [facility]  [facility]  [place]  Heart & Vascular at [facility] [address], [address] [place], [place] [address] [phone]

## 2023-03-01 NOTE — Patient Instructions (Signed)
 Medication Instructions:  Your physician recommends that you continue on your current medications as directed. Please refer to the Current Medication list given to you today.   Follow-Up: At [facility], you and your health needs are our priority.  As part of our continuing mission to provide you with exceptional heart care, we have created designated Provider Care Teams.  These Care Teams include your primary Cardiologist (physician) and Advanced Practice Providers (APPs -  Physician Assistants and Nurse Practitioners) who all work together to provide you with the care you need, when you need it.  We recommend signing up for the patient portal called "MyChart".  Sign up information is provided on this After Visit Summary.  MyChart is used to connect with patients for Virtual Visits (Telemedicine).  Patients are able to view lab/test results, encounter notes, upcoming appointments, etc.  Non-urgent messages can be sent to your provider as well.   To learn more about what you can do with MyChart, go to [email or web address].    Your next appointment:   3 to 4 month(s)  Provider:   Jodelle Red, MD

## 2023-03-02 ENCOUNTER — Other Ambulatory Visit (HOSPITAL_COMMUNITY): Payer: Self-pay

## 2023-03-02 ENCOUNTER — Telehealth: Payer: Self-pay | Admitting: Pharmacy Technician

## 2023-03-02 NOTE — Telephone Encounter (Addendum)
 Pharmacy Patient Advocate Encounter   Received notification from Physician's Office that prior authorization for Zepbound is required/requested.   Insurance verification completed.   The patient is insured through [employer] .   Per test claim: PA required; PA submitted to above mentioned insurance via CoverMyMeds Key/confirmation #/EOC [id number] Status is pending

## 2023-03-02 NOTE — Telephone Encounter (Signed)
 Pharmacy Patient Advocate Encounter  Received notification from [facility] that Prior Authorization for zepbound has been DENIED.  Full denial letter will be uploaded to the media tab. See denial reason below.   PA #/Case ID/Reference #: [id number]

## 2023-03-02 NOTE — Telephone Encounter (Signed)
-----   Message from Cheree Ditto sent at 03/02/2023 10:14 AM EST ----- Regarding: FW: coverage for GLP1... Please complete PA for Zepbound ----- Message ----- From: Jodelle Red, MD Sent: 03/01/2023  12:30 PM EST To: Cv Div Pharmd Subject: coverage for GLP1...                           This patient is perfect for GLP--I'd like zepbound if covered but if not, wegovy. She doesn't have diabetes but otherwise has all the metabolic syndrome pieces, including fatty liver followed at [facility]. Could you tell me if she has coverage for GLP on her insurance plan? Thank you!

## 2023-03-03 ENCOUNTER — Telehealth: Payer: Self-pay | Admitting: Pharmacy Technician

## 2023-03-03 ENCOUNTER — Other Ambulatory Visit (HOSPITAL_COMMUNITY): Payer: Self-pay

## 2023-03-03 NOTE — Telephone Encounter (Signed)
 Pharmacy Patient Advocate Encounter   Received notification from Pt Calls Messages that prior authorization for wegovy is required/requested.   Insurance verification completed.   The patient is insured through [employer] .   Per test claim: PA required; PA submitted to above mentioned insurance via CoverMyMeds Key/confirmation #/EOC [id number] Status is pending

## 2023-03-04 ENCOUNTER — Other Ambulatory Visit (HOSPITAL_COMMUNITY): Payer: Self-pay

## 2023-03-04 NOTE — Telephone Encounter (Addendum)
 Pharmacy Patient Advocate Encounter  Received notification from  Jocelyn Lamer  that Prior Authorization for Reginal Lutes has been DENIED.  Full denial letter will be uploaded to the media tab. See denial reason below.   PA #/Case ID/Reference #: [id number]

## 2023-03-05 ENCOUNTER — Encounter (HOSPITAL_BASED_OUTPATIENT_CLINIC_OR_DEPARTMENT_OTHER): Payer: Self-pay

## 2023-04-13 ENCOUNTER — Other Ambulatory Visit (HOSPITAL_COMMUNITY): Payer: Self-pay

## 2023-05-13 ENCOUNTER — Encounter (HOSPITAL_BASED_OUTPATIENT_CLINIC_OR_DEPARTMENT_OTHER): Payer: Self-pay

## 2023-05-13 DIAGNOSIS — R7303 Prediabetes: Secondary | ICD-10-CM

## 2023-05-28 LAB — HEMOGLOBIN A1C
Est. average glucose Bld gHb Est-mCnc: 143 mg/dL
Hgb A1c MFr Bld: 6.6 % — ABNORMAL HIGH (ref 4.8–5.6)

## 2023-05-29 ENCOUNTER — Ambulatory Visit (HOSPITAL_BASED_OUTPATIENT_CLINIC_OR_DEPARTMENT_OTHER): Payer: Self-pay | Admitting: Cardiology

## 2023-06-01 ENCOUNTER — Ambulatory Visit (HOSPITAL_BASED_OUTPATIENT_CLINIC_OR_DEPARTMENT_OTHER): Payer: 59 | Admitting: Cardiology

## 2023-06-01 ENCOUNTER — Telehealth: Payer: Self-pay | Admitting: Pharmacy Technician

## 2023-06-01 ENCOUNTER — Telehealth (HOSPITAL_BASED_OUTPATIENT_CLINIC_OR_DEPARTMENT_OTHER): Payer: Self-pay

## 2023-06-01 ENCOUNTER — Encounter (HOSPITAL_BASED_OUTPATIENT_CLINIC_OR_DEPARTMENT_OTHER): Payer: Self-pay | Admitting: Cardiology

## 2023-06-01 ENCOUNTER — Other Ambulatory Visit (HOSPITAL_COMMUNITY): Payer: Self-pay

## 2023-06-01 ENCOUNTER — Other Ambulatory Visit (HOSPITAL_BASED_OUTPATIENT_CLINIC_OR_DEPARTMENT_OTHER): Payer: Self-pay

## 2023-06-01 VITALS — BP 102/80 | HR 77 | Ht 66.0 in | Wt 310.3 lb

## 2023-06-01 DIAGNOSIS — E782 Mixed hyperlipidemia: Secondary | ICD-10-CM | POA: Diagnosis not present

## 2023-06-01 DIAGNOSIS — E8881 Metabolic syndrome: Secondary | ICD-10-CM

## 2023-06-01 DIAGNOSIS — R0609 Other forms of dyspnea: Secondary | ICD-10-CM | POA: Diagnosis not present

## 2023-06-01 DIAGNOSIS — Z7189 Other specified counseling: Secondary | ICD-10-CM

## 2023-06-01 DIAGNOSIS — Z8249 Family history of ischemic heart disease and other diseases of the circulatory system: Secondary | ICD-10-CM

## 2023-06-01 DIAGNOSIS — E119 Type 2 diabetes mellitus without complications: Secondary | ICD-10-CM | POA: Diagnosis not present

## 2023-06-01 DIAGNOSIS — G4733 Obstructive sleep apnea (adult) (pediatric): Secondary | ICD-10-CM

## 2023-06-01 MED ORDER — MOUNJARO 2.5 MG/0.5ML ~~LOC~~ SOAJ
2.5000 mg | SUBCUTANEOUS | 0 refills | Status: DC
  Filled 2023-06-01: qty 2, 28d supply, fill #0

## 2023-06-01 NOTE — Telephone Encounter (Signed)
 Collazo, Chantel M, LPN  Rx Prior Auth Team4 minutes ago (12:22 PM)    Pt started on Mounjaro, will need PA (Dx: Diabetes)    This is being filled at our pharmacy now 25.00. no rejection

## 2023-06-01 NOTE — Telephone Encounter (Signed)
 PA request sent to PA team

## 2023-06-01 NOTE — Progress Notes (Signed)
 Cardiology Office Note:  .   Date:  06/01/2023  ID:  Meghan Wallace, DOB [birth year], MRN [id number] PCP: Lionel Riddle, MD  [facility] Providers Cardiologist:  Sheryle Donning, MD {  History of Present Illness: .   Meghan Wallace is a 48 y.o. female with PMH prediabetes, high cholesterol, PCOS, obesity, NAFLD/NASH who is seen for follow up. I met her as a new consult in 01/2023 for exertional chest pain and shortness of breath.  CV history: -Lipids from 03/02/22 show Tchol 167, TG 242, HDL 40, LDL 87. -Family history:  father had MI around age 21 (also a smoker), pat Gpa died of MI. Mother and grandmother have high cholesterol.  Likely metabolic syndrome. Has been told her cholesterol is abnormal her whole life. Has been diagnosed with PCOS, symptoms since adolescence. Started taking synthroid  in college. Now diagnosed with pre-diabetes, though has been on metformin  for a long time (~2005), used in prep for fertility. Conceived naturally for her son, though had tried with meds prior. Has IUD now. Noted that she had elevated liver enzymes slightly for a few years, then had ultrasound done which showed enlarged liver. Then had fibroscan which showed fibrosis. Follows with [facility] annually for nonalcoholic fatty liver disease/NASH since 2020.   On atorvastatin 20 mg daily. Was on fenofibrate in the past.  Today: Since stopping the metformin , has been more tired and out of breath with activity. Also saw her sleep apnea physician, recommended for GLP for sleep apnea but wasn't covered by her insurance at that time. Since then, her A1c is 6.6, so she has formal diagnosis of diabetes. We discussed GLP at length, see below.  ROS: Denies shortness of breath. No PND, orthopnea, LE edema or unexpected weight gain. No syncope or palpitations. ROS otherwise negative except as noted.   Studies Reviewed: Aaron Aas    EKG:  EKG Interpretation Date/Time:  Tuesday Jun 01 2023 11:42:17 EDT Ventricular Rate:   77 PR Interval:  178 QRS Duration:  88 QT Interval:  348 QTC Calculation: 393 R Axis:   19  Text Interpretation: Normal sinus rhythm Normal ECG When compared with ECG of 18-Jan-2023 14:47, No significant change was found Confirmed by Sheryle Donning [phone]) on 06/01/2023 12:00:38 PM    Physical Exam:   VS:  BP 102/80 (BP Location: Right Arm, Patient Position: Sitting, Cuff Size: Large)   Pulse 77   Ht 5\' 6"  (1.676 m)   Wt (!) 310 lb 4.8 oz (140.8 kg)   SpO2 97%   BMI 50.08 kg/m    Wt Readings from Last 3 Encounters:  06/01/23 (!) 310 lb 4.8 oz (140.8 kg)  03/01/23 (!) 304 lb 14.4 oz (138.3 kg)  01/18/23 (!) 302 lb 9.6 oz (137.3 kg)    GEN: Well nourished, well developed in no acute distress HEENT: Normal, moist mucous membranes NECK: No JVD CARDIAC: regular rhythm, normal S1 and S2, no rubs or gallops. No murmur. VASCULAR: Radial and DP pulses 2+ bilaterally. No carotid bruits RESPIRATORY:  Clear to auscultation without rales, wheezing or rhonchi  ABDOMEN: Soft, non-tender, non-distended MUSCULOSKELETAL:  Ambulates independently SKIN: Warm and dry, no edema NEUROLOGIC:  Alert and oriented x 3. No focal neuro deficits noted. PSYCHIATRIC:  Normal affect    ASSESSMENT AND PLAN: .    Dyspnea on exertion -stress test without ischemia. Did have hypertensive response -discussed aiming for moderate intensity exercise. She is currently swimming, walking, doing gentle yoga, encouraged her to continue this -discussed addition of weight training/resistance  to preserve/build muscle mass long term -reviewed red flag warning signs that need immediate medical attention   Metabolic syndrome, with  Type II diabetes PCOS Mixed hyperlipidemia NAFLD/NASH Obesity Abdominal adiposity -we have discussed metabolic syndrome at length, see prior notes -continue atorvastatin for primary prevention -she is now formally diabetic with A1c 6.6. She had GI issues, did not tolerate metformin .  Based on this, she is a good candidate for GLP  -Patient has a BMI of over 30 or over 27 with a weight related comorbidity -Current BMI 50 -Starting weight 310 lbs -Weight related comorbidities: metabolic syndrome, PCOS, diabetes, NAFLD, OSA -Has attempted weight loss with diet/exercise changes. Has tried metformin  and multiple diet programs in the past. She is routinely physically active. Has not been able to lose >5% of weight on her own -Patient is medically appropriate for GLP treatment for weight loss -discussed potential side effects, including GI discomfort (nausea, constipation, diarrhea). Discussed data re: pancreatitis. No known history of multiple endocrine neoplasia.  We also discussed her remote history of anorexia. This has been well controlled for a long time. Reviewed signs/symptoms to watch for that would make us  stop GLP.  Mounjaro  starting plan -2.5 mg dose every week for 4 weeks, -Then use the 5 mg dose every week for 4 weeks, -Then use the 7.5 mg dose every week for 4 weeks, -Then use the 10 mg dose every week for 4 weeks, -Then use the 15 mg dose every week indefinitely  CV risk counseling and prevention -recommend heart healthy/Mediterranean diet, with whole grains, fruits, vegetable, fish, lean meats, nuts, and olive oil. Limit salt. -recommend moderate walking, 3-5 times/week for 30-50 minutes each session. Aim for at least 150 minutes.week. Goal should be pace of 3 miles/hours, or walking 1.5 miles in 30 minutes -recommend avoidance of tobacco products. Avoid excess alcohol.  Dispo: 3 months  Signed, Sheryle Donning, MD   Sheryle Donning, MD, PhD, [facility]  [facility]  [place]  [facility] & Vascular at [facility] at [facility] [address], [address] [place], [place] [address] [phone]

## 2023-06-01 NOTE — Patient Instructions (Signed)
 Medication Instructions:  Your physician has recommended you make the following change in your medication:  START Mounjaro (we have sent in prior auth - someone will contact you with approval/denial)  Follow-Up: Please follow up in 3 months with Dr. Veryl Gottron, Slater Duncan, NP or Neomi Banks, NP

## 2023-06-19 ENCOUNTER — Encounter (HOSPITAL_BASED_OUTPATIENT_CLINIC_OR_DEPARTMENT_OTHER): Payer: Self-pay

## 2023-06-21 ENCOUNTER — Other Ambulatory Visit (HOSPITAL_BASED_OUTPATIENT_CLINIC_OR_DEPARTMENT_OTHER): Payer: Self-pay

## 2023-06-21 ENCOUNTER — Other Ambulatory Visit: Payer: Self-pay

## 2023-06-21 MED ORDER — MOUNJARO 7.5 MG/0.5ML ~~LOC~~ SOAJ
7.5000 mg | SUBCUTANEOUS | 0 refills | Status: DC
  Filled 2023-06-21 – 2023-07-23 (×2): qty 2, 28d supply, fill #0

## 2023-06-21 MED ORDER — MOUNJARO 12.5 MG/0.5ML ~~LOC~~ SOAJ
12.5000 mg | SUBCUTANEOUS | 0 refills | Status: DC
  Filled 2023-06-21 – 2023-09-16 (×2): qty 2, 28d supply, fill #0

## 2023-06-21 MED ORDER — MOUNJARO 5 MG/0.5ML ~~LOC~~ SOAJ
5.0000 mg | SUBCUTANEOUS | 0 refills | Status: DC
  Filled 2023-06-21: qty 2, 28d supply, fill #0

## 2023-06-21 MED ORDER — MOUNJARO 10 MG/0.5ML ~~LOC~~ SOAJ
10.0000 mg | SUBCUTANEOUS | 0 refills | Status: DC
  Filled 2023-06-21 – 2023-08-21 (×2): qty 2, 28d supply, fill #0

## 2023-06-21 MED ORDER — MOUNJARO 15 MG/0.5ML ~~LOC~~ SOAJ
15.0000 mg | SUBCUTANEOUS | 3 refills | Status: AC
  Filled 2023-06-21: qty 6, 84d supply, fill #0
  Filled 2023-10-16: qty 2, 28d supply, fill #0

## 2023-06-21 NOTE — Addendum Note (Signed)
 Addended by: Guss Legacy on: 06/21/2023 07:38 AM   Modules accepted: Orders

## 2023-07-23 ENCOUNTER — Other Ambulatory Visit: Payer: Self-pay

## 2023-07-23 ENCOUNTER — Other Ambulatory Visit (HOSPITAL_BASED_OUTPATIENT_CLINIC_OR_DEPARTMENT_OTHER): Payer: Self-pay

## 2023-08-21 ENCOUNTER — Other Ambulatory Visit (HOSPITAL_BASED_OUTPATIENT_CLINIC_OR_DEPARTMENT_OTHER): Payer: Self-pay

## 2023-09-10 ENCOUNTER — Encounter (HOSPITAL_BASED_OUTPATIENT_CLINIC_OR_DEPARTMENT_OTHER): Payer: Self-pay | Admitting: Cardiology

## 2023-09-10 ENCOUNTER — Ambulatory Visit (HOSPITAL_BASED_OUTPATIENT_CLINIC_OR_DEPARTMENT_OTHER): Admitting: Cardiology

## 2023-09-10 VITALS — BP 108/68 | HR 73 | Resp 16 | Ht 66.0 in | Wt 300.1 lb

## 2023-09-10 DIAGNOSIS — E119 Type 2 diabetes mellitus without complications: Secondary | ICD-10-CM | POA: Diagnosis not present

## 2023-09-10 DIAGNOSIS — E782 Mixed hyperlipidemia: Secondary | ICD-10-CM

## 2023-09-10 DIAGNOSIS — E8881 Metabolic syndrome: Secondary | ICD-10-CM | POA: Diagnosis not present

## 2023-09-10 NOTE — Patient Instructions (Signed)
 Medication Instructions:  Your physician recommends that you continue on your current medications as directed. Please refer to the Current Medication list given to you today.  *If you need a refill on your cardiac medications before your next appointment, please call your pharmacy*  Follow-Up: At [facility], you and your health needs are our priority.  As part of our continuing mission to provide you with exceptional heart care, our providers are all part of one team.  This team includes your primary Cardiologist (physician) and Advanced Practice Providers or APPs (Physician Assistants and Nurse Practitioners) who all work together to provide you with the care you need, when you need it.  Your next appointment:   6 month  Provider:   Shelda Bruckner, MD

## 2023-09-10 NOTE — Progress Notes (Signed)
 Cardiology Office Note:  .   Date:  09/10/2023  ID:  Meghan Wallace, DOB July 13, 1975, MRN [id number] PCP: Tamra Kay, MD  [facility] Providers Cardiologist:  Shelda Bruckner, MD {  History of Present Illness: .   Meghan Wallace is a 48 y.o. female with PMH type II diabetes, metabolic syndrome, high cholesterol, PCOS, obesity, NAFLD/NASH who is seen for follow up. I met her as a new consult in 01/2023 for exertional chest pain and shortness of breath.  CV history: -Lipids from 03/02/22 show Tchol 167, TG 242, HDL 40, LDL 87. -Family history:  father had MI around age 86 (also a smoker), pat Gpa died of MI. Mother and grandmother have high cholesterol.  She has metabolic syndrome. Has been told her cholesterol is abnormal her whole life. Has been diagnosed with PCOS, symptoms since adolescence. Started taking synthroid  in college. Now diagnosed with pre-diabetes, though has been on metformin  for a long time (~2005), used in prep for fertility. Conceived naturally for her son, though had tried with meds prior. Has IUD now. Noted that she had elevated liver enzymes slightly for a few years, then had ultrasound done which showed enlarged liver. Then had fibroscan which showed fibrosis. Follows with [facility] annually for nonalcoholic fatty liver disease/NASH since 2020.   On atorvastatin 20 mg daily. Was on fenofibrate in the past.  Today: Tolerating mounjaro  well. When she first started, noted some side effects of not feeling well/poor appetite, not nausea per se. Since gradually going up on the dose, she notes that by the 4th week of each dose she feels almost no side effects. Also working with dietician through her PCP. Has been adjusting her food routine. Has helped a lot with extreme hunger in the morning and sugar cravings.   Thinks she is on 10 mg weekly dose of tirzepatide  currently. Making sure she gets fiber, protein, and water. Has occasional diarrhea and constipation, but able to  manage this.   Energy has improved. She is back to swimming, dyspnea on exertion is better.   ROS: Denies shortness of breath at rest. No PND, orthopnea, LE edema or unexpected weight gain. No syncope or palpitations. ROS otherwise negative except as noted.   Studies Reviewed: SABRA    EKG:       Physical Exam:   VS:  BP 108/68 (BP Location: Left Arm, Patient Position: Sitting, Cuff Size: Large)   Pulse 73   Resp 16   Ht 5' 6 (1.676 m)   Wt (!) 300 lb 1.6 oz (136.1 kg)   SpO2 97%   BMI 48.44 kg/m    Wt Readings from Last 3 Encounters:  09/10/23 (!) 300 lb 1.6 oz (136.1 kg)  06/01/23 (!) 310 lb 4.8 oz (140.8 kg)  03/01/23 (!) 304 lb 14.4 oz (138.3 kg)    GEN: Well nourished, well developed in no acute distress HEENT: Normal, moist mucous membranes NECK: No JVD CARDIAC: regular rhythm, normal S1 and S2, no rubs or gallops. No murmur. VASCULAR: Radial and DP pulses 2+ bilaterally. No carotid bruits RESPIRATORY:  Clear to auscultation without rales, wheezing or rhonchi  ABDOMEN: Soft, non-tender, non-distended MUSCULOSKELETAL:  Ambulates independently SKIN: Warm and dry, no edema NEUROLOGIC:  Alert and oriented x 3. No focal neuro deficits noted. PSYCHIATRIC:  Normal affect    ASSESSMENT AND PLAN: .    Dyspnea on exertion -improving with weight loss -stress test without ischemia. Did have hypertensive response -discussed aiming for moderate intensity exercise. She is currently  swimming, walking, doing gentle yoga, encouraged her to continue this -discussed addition of weight training/resistance to preserve/build muscle mass long term -reviewed red flag warning signs that need immediate medical attention   Metabolic syndrome, with  Type II diabetes PCOS Mixed hyperlipidemia NAFLD/NASH Obesity Abdominal adiposity -we have discussed metabolic syndrome at length, see prior notes -continue atorvastatin for primary prevention -started Mounjaro  at last visit. Tolerating well.   -Starting weight 310 lbs (BMI 50) -Weight related comorbidities: metabolic syndrome, PCOS, diabetes, NAFLD, OSA -We also discussed her remote history of anorexia. This has been well controlled for a long time. Reviewed signs/symptoms to watch for that would make us  stop GLP. -was on metformin  in the past, did not notice improvement and had significant GI side effects with this. Would not restart if A1c is well controlled on GLP  CV risk counseling and prevention -recommend heart healthy/Mediterranean diet, with whole grains, fruits, vegetable, fish, lean meats, nuts, and olive oil. Limit salt. -recommend moderate walking, 3-5 times/week for 30-50 minutes each session. Aim for at least 150 minutes.week. Goal should be pace of 3 miles/hours, or walking 1.5 miles in 30 minutes -recommend avoidance of tobacco products. Avoid excess alcohol.  Dispo: 6 months or sooner as needed  Signed, Shelda Bruckner, MD   Shelda Bruckner, MD, PhD, [facility]  [facility]  [place]  Heart & Vascular at [facility] at [facility] [address] [place], [place] [address] [phone]

## 2023-09-16 ENCOUNTER — Other Ambulatory Visit (HOSPITAL_BASED_OUTPATIENT_CLINIC_OR_DEPARTMENT_OTHER): Payer: Self-pay

## 2023-10-16 ENCOUNTER — Other Ambulatory Visit (HOSPITAL_BASED_OUTPATIENT_CLINIC_OR_DEPARTMENT_OTHER): Payer: Self-pay

## 2023-10-18 ENCOUNTER — Other Ambulatory Visit (HOSPITAL_BASED_OUTPATIENT_CLINIC_OR_DEPARTMENT_OTHER): Payer: Self-pay

## 2023-10-18 ENCOUNTER — Other Ambulatory Visit (HOSPITAL_BASED_OUTPATIENT_CLINIC_OR_DEPARTMENT_OTHER): Payer: Self-pay | Admitting: Cardiology

## 2023-10-18 DIAGNOSIS — E119 Type 2 diabetes mellitus without complications: Secondary | ICD-10-CM

## 2023-10-18 MED ORDER — MOUNJARO 12.5 MG/0.5ML ~~LOC~~ SOAJ
12.5000 mg | SUBCUTANEOUS | 0 refills | Status: DC
  Filled 2023-10-18: qty 2, 28d supply, fill #0

## 2023-10-22 ENCOUNTER — Other Ambulatory Visit (HOSPITAL_BASED_OUTPATIENT_CLINIC_OR_DEPARTMENT_OTHER): Payer: Self-pay

## 2023-11-06 ENCOUNTER — Encounter (HOSPITAL_BASED_OUTPATIENT_CLINIC_OR_DEPARTMENT_OTHER): Payer: Self-pay

## 2023-11-15 ENCOUNTER — Other Ambulatory Visit (HOSPITAL_BASED_OUTPATIENT_CLINIC_OR_DEPARTMENT_OTHER): Payer: Self-pay

## 2023-11-15 ENCOUNTER — Other Ambulatory Visit (HOSPITAL_BASED_OUTPATIENT_CLINIC_OR_DEPARTMENT_OTHER): Payer: Self-pay | Admitting: Cardiology

## 2023-11-15 DIAGNOSIS — E119 Type 2 diabetes mellitus without complications: Secondary | ICD-10-CM

## 2023-11-15 MED ORDER — MOUNJARO 12.5 MG/0.5ML ~~LOC~~ SOAJ
12.5000 mg | SUBCUTANEOUS | 0 refills | Status: DC
  Filled 2023-11-15: qty 2, 28d supply, fill #0

## 2023-12-15 ENCOUNTER — Other Ambulatory Visit (HOSPITAL_BASED_OUTPATIENT_CLINIC_OR_DEPARTMENT_OTHER): Payer: Self-pay | Admitting: Cardiology

## 2023-12-15 ENCOUNTER — Other Ambulatory Visit (HOSPITAL_BASED_OUTPATIENT_CLINIC_OR_DEPARTMENT_OTHER): Payer: Self-pay

## 2023-12-15 DIAGNOSIS — E119 Type 2 diabetes mellitus without complications: Secondary | ICD-10-CM

## 2023-12-15 MED ORDER — MOUNJARO 12.5 MG/0.5ML ~~LOC~~ SOAJ
12.5000 mg | SUBCUTANEOUS | 0 refills | Status: DC
  Filled 2023-12-15: qty 2, 28d supply, fill #0

## 2024-01-12 ENCOUNTER — Other Ambulatory Visit (HOSPITAL_BASED_OUTPATIENT_CLINIC_OR_DEPARTMENT_OTHER): Payer: Self-pay

## 2024-01-12 ENCOUNTER — Other Ambulatory Visit (HOSPITAL_BASED_OUTPATIENT_CLINIC_OR_DEPARTMENT_OTHER): Payer: Self-pay | Admitting: Cardiology

## 2024-01-12 DIAGNOSIS — E119 Type 2 diabetes mellitus without complications: Secondary | ICD-10-CM

## 2024-01-12 MED ORDER — MOUNJARO 12.5 MG/0.5ML ~~LOC~~ SOAJ
12.5000 mg | SUBCUTANEOUS | 0 refills | Status: DC
  Filled 2024-01-12: qty 2, 28d supply, fill #0

## 2024-02-06 ENCOUNTER — Other Ambulatory Visit (HOSPITAL_BASED_OUTPATIENT_CLINIC_OR_DEPARTMENT_OTHER): Payer: Self-pay | Admitting: Cardiology

## 2024-02-06 DIAGNOSIS — E119 Type 2 diabetes mellitus without complications: Secondary | ICD-10-CM

## 2024-02-07 ENCOUNTER — Other Ambulatory Visit (HOSPITAL_BASED_OUTPATIENT_CLINIC_OR_DEPARTMENT_OTHER): Payer: Self-pay

## 2024-02-07 MED ORDER — MOUNJARO 12.5 MG/0.5ML ~~LOC~~ SOAJ
12.5000 mg | SUBCUTANEOUS | 2 refills | Status: AC
  Filled 2024-02-07: qty 2, 28d supply, fill #0

## 2024-03-16 ENCOUNTER — Ambulatory Visit (HOSPITAL_BASED_OUTPATIENT_CLINIC_OR_DEPARTMENT_OTHER): Admitting: Cardiology

## 2024-03-23 ENCOUNTER — Ambulatory Visit (HOSPITAL_BASED_OUTPATIENT_CLINIC_OR_DEPARTMENT_OTHER): Admitting: Cardiology

## 2024-03-27 ENCOUNTER — Ambulatory Visit (HOSPITAL_BASED_OUTPATIENT_CLINIC_OR_DEPARTMENT_OTHER): Admitting: Cardiology
